# Patient Record
Sex: Female | Born: 1965 | Race: Black or African American | Hispanic: No | Smoking: Never smoker
Health system: Southern US, Community
[De-identification: ages and names within clinical notes are randomized; demographics above are authoritative.]

## PROBLEM LIST (undated history)

## (undated) DIAGNOSIS — M797 Fibromyalgia: Secondary | ICD-10-CM

## (undated) DIAGNOSIS — F32A Depression, unspecified: Secondary | ICD-10-CM

## (undated) DIAGNOSIS — G932 Benign intracranial hypertension: Secondary | ICD-10-CM

## (undated) DIAGNOSIS — G4700 Insomnia, unspecified: Secondary | ICD-10-CM

## (undated) DIAGNOSIS — F419 Anxiety disorder, unspecified: Secondary | ICD-10-CM

## (undated) DIAGNOSIS — G43909 Migraine, unspecified, not intractable, without status migrainosus: Secondary | ICD-10-CM

## (undated) HISTORY — PX: LEG SURGERY: SHX1003

## (undated) HISTORY — PX: SMALL INTESTINE SURGERY: SHX150

---

## 2020-09-10 ENCOUNTER — Other Ambulatory Visit: Payer: Self-pay

## 2020-09-10 ENCOUNTER — Ambulatory Visit
Admission: EM | Admit: 2020-09-10 | Discharge: 2020-09-10 | Disposition: A | Discharge status: Home / Self Care | Payer: Medicare HMO | Attending: Emergency Medicine | Admitting: Emergency Medicine

## 2020-09-10 DIAGNOSIS — U071 COVID-19: Secondary | ICD-10-CM | POA: Diagnosis not present

## 2020-09-10 DIAGNOSIS — M797 Fibromyalgia: Secondary | ICD-10-CM | POA: Diagnosis not present

## 2020-09-10 DIAGNOSIS — Z881 Allergy status to other antibiotic agents status: Secondary | ICD-10-CM | POA: Diagnosis not present

## 2020-09-10 DIAGNOSIS — G4700 Insomnia, unspecified: Secondary | ICD-10-CM | POA: Diagnosis not present

## 2020-09-10 DIAGNOSIS — F419 Anxiety disorder, unspecified: Secondary | ICD-10-CM | POA: Insufficient documentation

## 2020-09-10 DIAGNOSIS — Z888 Allergy status to other drugs, medicaments and biological substances status: Secondary | ICD-10-CM | POA: Insufficient documentation

## 2020-09-10 DIAGNOSIS — J069 Acute upper respiratory infection, unspecified: Secondary | ICD-10-CM

## 2020-09-10 DIAGNOSIS — Z79899 Other long term (current) drug therapy: Secondary | ICD-10-CM | POA: Diagnosis not present

## 2020-09-10 DIAGNOSIS — F329 Major depressive disorder, single episode, unspecified: Secondary | ICD-10-CM | POA: Diagnosis not present

## 2020-09-10 DIAGNOSIS — Z20822 Contact with and (suspected) exposure to covid-19: Secondary | ICD-10-CM | POA: Diagnosis present

## 2020-09-10 HISTORY — DX: Depression, unspecified: F32.A

## 2020-09-10 HISTORY — DX: Benign intracranial hypertension: G93.2

## 2020-09-10 HISTORY — DX: Insomnia, unspecified: G47.00

## 2020-09-10 HISTORY — DX: Fibromyalgia: M79.7

## 2020-09-10 HISTORY — DX: Anxiety disorder, unspecified: F41.9

## 2020-09-10 HISTORY — DX: Migraine, unspecified, not intractable, without status migrainosus: G43.909

## 2020-09-10 NOTE — Discharge Instructions (Addendum)
Continue to quarantine at home until you have not been febrile for 24 hours without medication or after 10 days if your symptoms are improving.   Use OTC Tylenol as needed for pain and fever. If your fever and pain do not respond to Tylenol you can try children's Ibuprofen liquid- take it with food and only as a last resort given your gastric bypass history.  Rest, push fluids, and return for worsening symptoms.

## 2020-09-10 NOTE — ED Triage Notes (Signed)
Patient in today w/ c/o cough, chills, fever, B/A, H/A, "scratchy" throat. Patient denies loss of taste/smell. Sx onset x 2 days. Patient states her daughter is COVID positive.   Patient is not vaccinated against COVID-19.

## 2020-09-10 NOTE — ED Provider Notes (Signed)
[facility]    CSN: [id number] Arrival date & time: 09/10/20  0803      History   Chief Complaint Chief Complaint  Patient presents with   Covid Exposure   Cough   Headache   Generalized Body Aches   Chills   Fever    HPI Jill Garcia is a 54 y.o. female.   54 yo female here for evaluation of HA, cough, scratchy throat, chills, sinus pressure and body aches x 3 days. Her daughter is COVID positive- diagnosed 10 days ago. Pt had a negative COVID test at that time. She is not vaccinated and has been using Turmeric, Emergen-C, APAP, Mucinex, and tea with minimal improvement at home. No SOB, N/V/D, or runny nose.      Past Medical History:  Diagnosis Date   Anxiety    Depression    Fibromyalgia    Insomnia    Migraines    Pseudotumor     There are no problems to display for this patient.   Past Surgical History:  Procedure Laterality Date   LEG SURGERY     SMALL INTESTINE SURGERY      OB History   No obstetric history on file.      Home Medications    Prior to Admission medications   Medication Sig Start Date End Date Taking? Authorizing Provider  AIMOVIG 140 MG/ML SOAJ SMARTSIG:140 Milligram(s) SUB-Q Once a Month 08/31/20  Yes [provider]  ALPRAZolam Prudy Feeler) 0.5 MG tablet Take by mouth.   Yes [provider]  chlorzoxazone (PARAFON) 500 MG tablet Take by mouth. 08/08/20  Yes [provider]  cyanocobalamin (,VITAMIN B-12,) 1000 MCG/ML injection Inject into the muscle.   Yes [provider]  gabapentin (NEURONTIN) 300 MG capsule Take 300 mg by mouth. 08/01/20  Yes [provider]  Ketorolac Tromethamine (SPRIX) 15.75 MG/SPRAY SOLN Use ONE psray IN each nostril EVERY 8 HOURS AS NEEDED for migraine for 5 days 02/10/20  Yes [provider]  nitroGLYCERIN (NITROSTAT) 0.4 MG SL tablet SMARTSIG:1 Tablet(s) Sublingual PRN 04/03/20  Yes [provider]  sertraline (ZOLOFT)  50 MG tablet Take 50 mg by mouth at bedtime. 06/17/20  Yes [provider]  tiZANidine (ZANAFLEX) 2 MG tablet Take 2 mg by mouth 3 (three) times daily as needed. 05/12/20  Yes [provider]  topiramate (TOPAMAX) 200 MG tablet Take 200 mg by mouth at bedtime. 08/02/20  Yes [provider]  traZODone (DESYREL) 50 MG tablet Take by mouth.   Yes [provider]    Family History No family history on file.  Social History Social History   Tobacco Use   Smoking status: Never Smoker   Smokeless tobacco: Never Used  Substance Use Topics   Alcohol use: Never   Drug use: Not on file     Allergies   Tape and Clindamycin   Review of Systems Review of Systems  Constitutional: Positive for chills, fatigue and fever. Negative for activity change and appetite change.  HENT: Positive for congestion, postnasal drip, sinus pressure and sore throat. Negative for ear discharge, ear pain and sinus pain.   Respiratory: Positive for cough. Negative for chest tightness and shortness of breath.   Cardiovascular: Negative for chest pain.  Gastrointestinal: Negative for diarrhea, nausea and vomiting.  Musculoskeletal: Positive for arthralgias and myalgias.  Skin: Negative for color change and rash.  Neurological: Positive for headaches. Negative for tremors.  Psychiatric/Behavioral: Negative.      Physical  Exam Triage Vital Signs ED Triage Vitals  Enc Vitals Group     BP 09/10/20 0825 128/84     Pulse Rate 09/10/20 0825 78     Resp 09/10/20 0825 18     Temp 09/10/20 0825 (!) 102.5 F (39.2 C)     Temp Source 09/10/20 0825 Oral     SpO2 09/10/20 0825 98 %     Weight 09/10/20 0827 284 lb (128.8 kg)     Height 09/10/20 0827 5\' 6"  (1.676 m)     Head Circumference --      Peak Flow --      Pain Score 09/10/20 0826 9     Pain Loc --      Pain Edu? --      Excl. in GC? --    No data found.  Updated Vital Signs BP 128/84 (BP Location: Left Arm)     Pulse 78    Temp (!) 102.5 F (39.2 C) (Oral)    Resp 18    Ht 5\' 6"  (1.676 m)    Wt 284 lb (128.8 kg)    SpO2 98%    BMI 45.84 kg/m   Visual Acuity Right Eye Distance:   Left Eye Distance:   Bilateral Distance:    Right Eye Near:   Left Eye Near:    Bilateral Near:     Physical Exam Vitals and nursing note reviewed.  Constitutional:      General: She is not in acute distress.    Appearance: She is well-developed. She is obese. She is ill-appearing.  HENT:     Head: Normocephalic and atraumatic.     Jaw: There is normal jaw occlusion.     Right Ear: Hearing, tympanic membrane, ear canal and external ear normal.     Left Ear: Hearing, tympanic membrane, ear canal and external ear normal.     Nose: Nose normal. No mucosal edema, congestion or rhinorrhea.     Right Nostril: No occlusion.     Left Nostril: No occlusion.     Right Turbinates: Not enlarged, swollen or pale.     Left Turbinates: Not enlarged, swollen or pale.     Right Sinus: No maxillary sinus tenderness or frontal sinus tenderness.     Left Sinus: No maxillary sinus tenderness or frontal sinus tenderness.     Mouth/Throat:     Lips: Pink.     Mouth: Mucous membranes are moist.     Tongue: No lesions. Tongue does not deviate from midline.     Pharynx: Oropharynx is clear.  Eyes:     Extraocular Movements: Extraocular movements intact.     Pupils: Pupils are equal, round, and reactive to light.  Cardiovascular:     Rate and Rhythm: Normal rate and regular rhythm.     Heart sounds: Normal heart sounds.  Pulmonary:     Effort: Pulmonary effort is normal.     Breath sounds: Normal breath sounds.  Musculoskeletal:        General: Normal range of motion.     Cervical back: Normal range of motion and neck supple.  Skin:    General: Skin is warm and dry.     Capillary Refill: Capillary refill takes less than 2 seconds.     Findings: No rash.  Neurological:     Mental Status: She is alert.      UC Treatments  / Results  Labs (all labs ordered are listed, but only abnormal results are displayed)  Labs Reviewed  SARS CORONAVIRUS 2 (TAT 6-24 HRS)    EKG   Radiology No results found.  Procedures Procedures (including critical care time)  Medications Ordered in UC Medications - No data to display  Initial Impression / Assessment and Plan / UC Course  I have reviewed the triage vital signs and the nursing notes.  Pertinent labs & imaging results that were available during my care of the patient were reviewed by me and considered in my medical decision making (see chart for details).   Patient presents with COVID symptoms. Her daughter is positive- diagnosed 10 days ago and has been in quarantine. The patient is not vaccinated against COVID and she developed symptoms 3 days ago.  PMH significant for gastric bypass, fibromyalgia, chronic pain, and lumbar radiculopathy. She is followed by [facility] and recentoly had a nerve block injection in L4-5 and sacral sac on 08/24/20.  She is febrile at 102 but no hypoxia or SOB.  Will test for COVID and D/C home with supportive care and quarantine.  Final Clinical Impressions(s) / UC Diagnoses   Final diagnoses:  Viral URI with cough     Discharge Instructions     Continue to quarantine at home until you have not been febrile for 24 hours without medication or after 10 days if your symptoms are improving.   Use OTC Tylenol as needed for pain and fever. If your fever and pain do not respond to Tylenol you can try children's Ibuprofen liquid- take it with food and only as a last resort given your gastric bypass history.  Rest, push fluids, and return for worsening symptoms.     ED Prescriptions    None     PDMP not reviewed this encounter.   Becky Augusta, NP 09/10/20 [phone]

## 2020-09-11 LAB — SARS CORONAVIRUS 2 (TAT 6-24 HRS): SARS Coronavirus 2: POSITIVE — AB

## 2020-09-15 ENCOUNTER — Encounter: Payer: Self-pay | Admitting: Emergency Medicine

## 2020-09-15 ENCOUNTER — Other Ambulatory Visit: Payer: Self-pay

## 2020-09-15 DIAGNOSIS — F419 Anxiety disorder, unspecified: Secondary | ICD-10-CM | POA: Diagnosis present

## 2020-09-15 DIAGNOSIS — Z888 Allergy status to other drugs, medicaments and biological substances status: Secondary | ICD-10-CM

## 2020-09-15 DIAGNOSIS — M797 Fibromyalgia: Secondary | ICD-10-CM | POA: Diagnosis present

## 2020-09-15 DIAGNOSIS — F329 Major depressive disorder, single episode, unspecified: Secondary | ICD-10-CM | POA: Diagnosis present

## 2020-09-15 DIAGNOSIS — J159 Unspecified bacterial pneumonia: Secondary | ICD-10-CM | POA: Diagnosis present

## 2020-09-15 DIAGNOSIS — G43909 Migraine, unspecified, not intractable, without status migrainosus: Secondary | ICD-10-CM | POA: Diagnosis present

## 2020-09-15 DIAGNOSIS — U071 COVID-19: Principal | ICD-10-CM | POA: Diagnosis present

## 2020-09-15 DIAGNOSIS — J1282 Pneumonia due to coronavirus disease 2019: Secondary | ICD-10-CM | POA: Diagnosis present

## 2020-09-15 DIAGNOSIS — Z79899 Other long term (current) drug therapy: Secondary | ICD-10-CM

## 2020-09-15 DIAGNOSIS — J9601 Acute respiratory failure with hypoxia: Secondary | ICD-10-CM | POA: Diagnosis present

## 2020-09-15 DIAGNOSIS — Z881 Allergy status to other antibiotic agents status: Secondary | ICD-10-CM

## 2020-09-15 DIAGNOSIS — A0839 Other viral enteritis: Secondary | ICD-10-CM | POA: Diagnosis present

## 2020-09-15 DIAGNOSIS — G932 Benign intracranial hypertension: Secondary | ICD-10-CM | POA: Diagnosis present

## 2020-09-15 LAB — CBC WITH DIFFERENTIAL/PLATELET
Abs Immature Granulocytes: 0.01 10*3/uL (ref 0.00–0.07)
Basophils Absolute: 0 10*3/uL (ref 0.0–0.1)
Basophils Relative: 0 %
Eosinophils Absolute: 0 10*3/uL (ref 0.0–0.5)
Eosinophils Relative: 0 %
HCT: 39.4 % (ref 36.0–46.0)
Hemoglobin: 12.6 g/dL (ref 12.0–15.0)
Immature Granulocytes: 0 %
Lymphocytes Relative: 11 %
Lymphs Abs: 0.4 10*3/uL — ABNORMAL LOW (ref 0.7–4.0)
MCH: 28.8 pg (ref 26.0–34.0)
MCHC: 32 g/dL (ref 30.0–36.0)
MCV: 90.2 fL (ref 80.0–100.0)
Monocytes Absolute: 0.1 10*3/uL (ref 0.1–1.0)
Monocytes Relative: 4 %
Neutro Abs: 2.9 10*3/uL (ref 1.7–7.7)
Neutrophils Relative %: 85 %
Platelets: 132 10*3/uL — ABNORMAL LOW (ref 150–400)
RBC: 4.37 MIL/uL (ref 3.87–5.11)
RDW: 12.6 % (ref 11.5–15.5)
WBC: 3.3 10*3/uL — ABNORMAL LOW (ref 4.0–10.5)
nRBC: 0 % (ref 0.0–0.2)

## 2020-09-15 LAB — BASIC METABOLIC PANEL
Anion gap: 5 (ref 5–15)
BUN: 12 mg/dL (ref 6–20)
CO2: 26 mmol/L (ref 22–32)
Calcium: 8.3 mg/dL — ABNORMAL LOW (ref 8.9–10.3)
Chloride: 106 mmol/L (ref 98–111)
Creatinine, Ser: 0.67 mg/dL (ref 0.44–1.00)
GFR calc Af Amer: >60 mL/min (ref >60)
GFR calc non Af Amer: >60 mL/min (ref >60)
Glucose, Bld: 146 mg/dL — ABNORMAL HIGH (ref 70–99)
Potassium: 3.8 mmol/L (ref 3.5–5.1)
Sodium: 137 mmol/L (ref 135–145)

## 2020-09-15 LAB — URINALYSIS, COMPLETE (UACMP) WITH MICROSCOPIC
Bilirubin Urine: NEGATIVE
Glucose, UA: NEGATIVE mg/dL
Hgb urine dipstick: NEGATIVE
Ketones, ur: NEGATIVE mg/dL
Leukocytes,Ua: NEGATIVE
Nitrite: NEGATIVE
Protein, ur: 100 mg/dL — AB
Specific Gravity, Urine: 1.033 — ABNORMAL HIGH (ref 1.005–1.030)
pH: 5 (ref 5.0–8.0)

## 2020-09-15 NOTE — ED Triage Notes (Signed)
Pt is Covid positive and arrives to the ED today due to being "winded and shaky" after her Covid antibody infusion yesterday. Pt has bilateral clear lung sounds and is in NAD.

## 2020-09-16 ENCOUNTER — Emergency Department: Payer: Medicare HMO

## 2020-09-16 ENCOUNTER — Encounter: Payer: Self-pay | Admitting: Family Medicine

## 2020-09-16 ENCOUNTER — Inpatient Hospital Stay
Admission: EM | Admit: 2020-09-16 | Discharge: 2020-09-20 | DRG: 177 | Disposition: A | Discharge status: Home Health | Payer: Medicare HMO | Attending: Internal Medicine | Admitting: Internal Medicine

## 2020-09-16 ENCOUNTER — Inpatient Hospital Stay: Payer: Medicare HMO

## 2020-09-16 DIAGNOSIS — Z888 Allergy status to other drugs, medicaments and biological substances status: Secondary | ICD-10-CM | POA: Diagnosis not present

## 2020-09-16 DIAGNOSIS — J1282 Pneumonia due to coronavirus disease 2019: Secondary | ICD-10-CM

## 2020-09-16 DIAGNOSIS — F419 Anxiety disorder, unspecified: Secondary | ICD-10-CM

## 2020-09-16 DIAGNOSIS — M797 Fibromyalgia: Secondary | ICD-10-CM

## 2020-09-16 DIAGNOSIS — J9601 Acute respiratory failure with hypoxia: Secondary | ICD-10-CM | POA: Diagnosis present

## 2020-09-16 DIAGNOSIS — Z79899 Other long term (current) drug therapy: Secondary | ICD-10-CM | POA: Diagnosis not present

## 2020-09-16 DIAGNOSIS — A0839 Other viral enteritis: Secondary | ICD-10-CM | POA: Diagnosis present

## 2020-09-16 DIAGNOSIS — G932 Benign intracranial hypertension: Secondary | ICD-10-CM

## 2020-09-16 DIAGNOSIS — G43909 Migraine, unspecified, not intractable, without status migrainosus: Secondary | ICD-10-CM | POA: Diagnosis present

## 2020-09-16 DIAGNOSIS — U071 COVID-19: Principal | ICD-10-CM | POA: Diagnosis present

## 2020-09-16 DIAGNOSIS — J159 Unspecified bacterial pneumonia: Secondary | ICD-10-CM | POA: Diagnosis present

## 2020-09-16 DIAGNOSIS — Z881 Allergy status to other antibiotic agents status: Secondary | ICD-10-CM | POA: Diagnosis not present

## 2020-09-16 DIAGNOSIS — F329 Major depressive disorder, single episode, unspecified: Secondary | ICD-10-CM | POA: Diagnosis present

## 2020-09-16 DIAGNOSIS — R0602 Shortness of breath: Secondary | ICD-10-CM | POA: Diagnosis present

## 2020-09-16 LAB — FIBRIN DERIVATIVES D-DIMER (ARMC ONLY)
Fibrin derivatives D-dimer (ARMC): 1105.27 ng/mL (FEU) — ABNORMAL HIGH (ref 0.00–499.00)
Fibrin derivatives D-dimer (ARMC): 994.84 ng/mL (FEU) — ABNORMAL HIGH (ref 0.00–499.00)

## 2020-09-16 LAB — HEMOGLOBIN A1C
Hgb A1c MFr Bld: 6 % — ABNORMAL HIGH (ref 4.8–5.6)
Mean Plasma Glucose: 125.5 mg/dL

## 2020-09-16 LAB — TROPONIN I (HIGH SENSITIVITY): Troponin I (High Sensitivity): 5 ng/L (ref <18)

## 2020-09-16 LAB — HIV ANTIBODY (ROUTINE TESTING W REFLEX): HIV Screen 4th Generation wRfx: NONREACTIVE

## 2020-09-16 MED ORDER — SODIUM CHLORIDE 0.9 % IV SOLN: INTRAVENOUS | Status: DC

## 2020-09-16 MED ORDER — ZINC SULFATE 220 (50 ZN) MG PO CAPS
220.0000 mg | ORAL_CAPSULE | Freq: Every day | ORAL | Status: DC
  Administered 2020-09-16 – 2020-09-20 (×5): 220 mg via ORAL
  Filled 2020-09-16 (×5): qty 1

## 2020-09-16 MED ORDER — ASCORBIC ACID 500 MG PO TABS
500.0000 mg | ORAL_TABLET | Freq: Every day | ORAL | Status: DC
  Administered 2020-09-16 – 2020-09-20 (×5): 500 mg via ORAL
  Filled 2020-09-16 (×5): qty 1

## 2020-09-16 MED ORDER — GUAIFENESIN ER 600 MG PO TB12
600.0000 mg | ORAL_TABLET | Freq: Two times a day (BID) | ORAL | Status: DC
  Administered 2020-09-16 – 2020-09-20 (×9): 600 mg via ORAL
  Filled 2020-09-16 (×9): qty 1

## 2020-09-16 MED ORDER — GABAPENTIN 300 MG PO CAPS
600.0000 mg | ORAL_CAPSULE | Freq: Every day | ORAL | Status: DC
  Filled 2020-09-16: qty 2

## 2020-09-16 MED ORDER — IOHEXOL 350 MG/ML SOLN
100.0000 mL | Freq: Once | INTRAVENOUS | Status: AC | PRN
  Administered 2020-09-16: 100 mL via INTRAVENOUS

## 2020-09-16 MED ORDER — PREDNISONE 20 MG PO TABS
50.0000 mg | ORAL_TABLET | Freq: Every day | ORAL | Status: DC
  Administered 2020-09-19 – 2020-09-20 (×2): 50 mg via ORAL
  Filled 2020-09-16 (×2): qty 1

## 2020-09-16 MED ORDER — LACTATED RINGERS IV BOLUS
1000.0000 mL | Freq: Once | INTRAVENOUS | Status: AC
  Administered 2020-09-16: 1000 mL via INTRAVENOUS

## 2020-09-16 MED ORDER — ACETAMINOPHEN 500 MG PO TABS
1000.0000 mg | ORAL_TABLET | Freq: Once | ORAL | Status: AC
  Administered 2020-09-16: 1000 mg via ORAL
  Filled 2020-09-16: qty 2

## 2020-09-16 MED ORDER — VITAMIN B-12 1000 MCG PO TABS
1000.0000 ug | ORAL_TABLET | Freq: Every day | ORAL | Status: DC
  Administered 2020-09-16 – 2020-09-20 (×5): 1000 ug via ORAL
  Filled 2020-09-16 (×5): qty 1

## 2020-09-16 MED ORDER — ASPIRIN EC 81 MG PO TBEC
81.0000 mg | DELAYED_RELEASE_TABLET | Freq: Every day | ORAL | Status: DC
  Administered 2020-09-17 – 2020-09-18 (×2): 81 mg via ORAL
  Filled 2020-09-16 (×5): qty 1

## 2020-09-16 MED ORDER — ONDANSETRON HCL 4 MG/2ML IJ SOLN: 4.0000 mg | Freq: Four times a day (QID) | INTRAMUSCULAR | Status: DC | PRN

## 2020-09-16 MED ORDER — VITAMIN D 25 MCG (1000 UNIT) PO TABS
1000.0000 [IU] | ORAL_TABLET | Freq: Every day | ORAL | Status: DC
  Administered 2020-09-16 – 2020-09-20 (×5): 1000 [IU] via ORAL
  Filled 2020-09-16 (×5): qty 1

## 2020-09-16 MED ORDER — TRAZODONE HCL 50 MG PO TABS
50.0000 mg | ORAL_TABLET | Freq: Every day | ORAL | Status: DC
  Administered 2020-09-17: 22:00:00 50 mg via ORAL
  Filled 2020-09-16 (×3): qty 1

## 2020-09-16 MED ORDER — ONDANSETRON HCL 4 MG PO TABS: 4.0000 mg | ORAL_TABLET | Freq: Four times a day (QID) | ORAL | Status: DC | PRN

## 2020-09-16 MED ORDER — FAMOTIDINE 20 MG PO TABS
20.0000 mg | ORAL_TABLET | Freq: Two times a day (BID) | ORAL | Status: DC
  Administered 2020-09-16 – 2020-09-20 (×9): 20 mg via ORAL
  Filled 2020-09-16 (×9): qty 1

## 2020-09-16 MED ORDER — SERTRALINE HCL 50 MG PO TABS
50.0000 mg | ORAL_TABLET | Freq: Every day | ORAL | Status: DC
  Administered 2020-09-16 – 2020-09-19 (×4): 50 mg via ORAL
  Filled 2020-09-16 (×4): qty 1

## 2020-09-16 MED ORDER — DULOXETINE HCL 60 MG PO CPEP
90.0000 mg | ORAL_CAPSULE | Freq: Every day | ORAL | Status: DC
  Filled 2020-09-16 (×5): qty 1

## 2020-09-16 MED ORDER — SODIUM CHLORIDE 0.9 % IV SOLN
100.0000 mg | Freq: Every day | INTRAVENOUS | Status: AC
  Administered 2020-09-17 – 2020-09-20 (×4): 100 mg via INTRAVENOUS
  Filled 2020-09-16 (×4): qty 20

## 2020-09-16 MED ORDER — NITROGLYCERIN 0.4 MG SL SUBL: 0.4000 mg | SUBLINGUAL_TABLET | SUBLINGUAL | Status: DC | PRN

## 2020-09-16 MED ORDER — ENOXAPARIN SODIUM 80 MG/0.8ML ~~LOC~~ SOLN
65.0000 mg | SUBCUTANEOUS | Status: DC
  Administered 2020-09-16 – 2020-09-20 (×5): 65 mg via SUBCUTANEOUS
  Filled 2020-09-16 (×5): qty 0.8

## 2020-09-16 MED ORDER — ALPRAZOLAM 0.5 MG PO TABS
0.5000 mg | ORAL_TABLET | Freq: Every day | ORAL | Status: DC
  Administered 2020-09-16 – 2020-09-19 (×3): 0.5 mg via ORAL
  Filled 2020-09-16 (×4): qty 1

## 2020-09-16 MED ORDER — MAGNESIUM HYDROXIDE 400 MG/5ML PO SUSP
30.0000 mL | Freq: Every day | ORAL | Status: DC | PRN
  Filled 2020-09-16: qty 30

## 2020-09-16 MED ORDER — TOPIRAMATE 100 MG PO TABS
200.0000 mg | ORAL_TABLET | Freq: Every day | ORAL | Status: DC
  Administered 2020-09-16 – 2020-09-19 (×4): 200 mg via ORAL
  Filled 2020-09-16 (×3): qty 2
  Filled 2020-09-16: qty 8
  Filled 2020-09-16 (×2): qty 2

## 2020-09-16 MED ORDER — METHYLPREDNISOLONE SODIUM SUCC 125 MG IJ SOLR
125.0000 mg | Freq: Once | INTRAMUSCULAR | Status: AC
  Administered 2020-09-16: 125 mg via INTRAVENOUS
  Filled 2020-09-16: qty 2

## 2020-09-16 MED ORDER — BARICITINIB 1 MG PO TABS
4.0000 mg | ORAL_TABLET | Freq: Every day | ORAL | Status: DC
  Administered 2020-09-16 – 2020-09-20 (×5): 4 mg via ORAL
  Filled 2020-09-16 (×4): qty 2
  Filled 2020-09-16: qty 4

## 2020-09-16 MED ORDER — HYDROCOD POLST-CPM POLST ER 10-8 MG/5ML PO SUER: 5.0000 mL | Freq: Two times a day (BID) | ORAL | Status: DC | PRN

## 2020-09-16 MED ORDER — GUAIFENESIN-DM 100-10 MG/5ML PO SYRP: 10.0000 mL | ORAL_SOLUTION | ORAL | Status: DC | PRN

## 2020-09-16 MED ORDER — SODIUM CHLORIDE 0.9 % IV SOLN
200.0000 mg | Freq: Once | INTRAVENOUS | Status: AC
  Administered 2020-09-16: 200 mg via INTRAVENOUS
  Filled 2020-09-16: qty 200

## 2020-09-16 MED ORDER — GABAPENTIN 300 MG PO CAPS
300.0000 mg | ORAL_CAPSULE | Freq: Two times a day (BID) | ORAL | Status: DC
  Administered 2020-09-16 – 2020-09-20 (×8): 300 mg via ORAL
  Filled 2020-09-16 (×8): qty 1

## 2020-09-16 MED ORDER — SODIUM CHLORIDE 0.9 % IV SOLN
1.0000 mg/kg | Freq: Two times a day (BID) | INTRAVENOUS | Status: AC
  Administered 2020-09-16 – 2020-09-19 (×6): 130 mg via INTRAVENOUS
  Filled 2020-09-16 (×7): qty 1.04

## 2020-09-16 MED ORDER — TRAZODONE HCL 50 MG PO TABS
25.0000 mg | ORAL_TABLET | Freq: Every evening | ORAL | Status: DC | PRN
  Filled 2020-09-16: qty 1

## 2020-09-16 MED ORDER — CHLORZOXAZONE 500 MG PO TABS
500.0000 mg | ORAL_TABLET | Freq: Four times a day (QID) | ORAL | Status: DC | PRN
  Administered 2020-09-16: 500 mg via ORAL
  Filled 2020-09-16 (×2): qty 1

## 2020-09-16 MED ORDER — TIZANIDINE HCL 2 MG PO TABS
2.0000 mg | ORAL_TABLET | Freq: Three times a day (TID) | ORAL | Status: DC | PRN
  Filled 2020-09-16: qty 1

## 2020-09-16 MED ORDER — ACETAMINOPHEN 325 MG PO TABS
650.0000 mg | ORAL_TABLET | Freq: Four times a day (QID) | ORAL | Status: DC | PRN
  Administered 2020-09-16: 11:00:00 650 mg via ORAL
  Filled 2020-09-16: qty 2

## 2020-09-16 NOTE — ED Notes (Addendum)
Pt out of room, states "I need stuff" pt informed this RN is her nurse and will be in shortly but must place protective airway gear in place to see pt. Pt refuses to get back in room. Pt states 'i've been in here and i'm cold and I don't have a nurse button". Pt provided with three warm blankets and again reiterated to pt will be in as soon as possible to see. Pt in no respiratory distress, resps unlabored and even.

## 2020-09-16 NOTE — H&P (Signed)
[place]   PATIENT NAME: Jill Garcia    MR#:  [id number]  DATE OF BIRTH:  15-Jul-1966  DATE OF ADMISSION:  09/16/2020  PRIMARY CARE PHYSICIAN: Roanna Raider, PA   REQUESTING/REFERRING PHYSICIAN: Nita Sickle, MD CHIEF COMPLAINT:   Chief Complaint  Patient presents with  . Medication Reaction    HISTORY OF PRESENT ILLNESS:  Jill Garcia  is a 54 y.o. African-American female with a known history of anxiety/depression, fibromyalgia and migraine, as well as COVID-19 that was diagnosed 6 days ago.  Her symptoms started on Saturday with generalized body aches as well as fever and chills, dry cough with rhinorrhea and nasal and sinus congestion.  She has been having worsening dyspnea as well as fatigue.  She admitted to occasional sore throat with cough.  She was seen yesterday at [facility] urgent care and received monoclonal antibody infusion.  She has been more short of breath since yesterday.  She admitted to diarrhea without nausea or vomiting.  No chest pain or palpitations.  No leg pain or edema.  No dysuria, oliguria or hematuria or flank pain.  She has not been vaccinated against COVID-19.  Upon presentation to the emergency room, respiratory rate was 26 with otherwise normal vital signs.  She later desatted to 89% on room air with minimal ambulation.  She was then placed on 2 L O2 by nasal cannula.  Labs revealed unremarkable CMP and CBC showed mild leukopenia with relative lymphopenia and thrombocytopenia.  Urinalysis showed 6-10 WBCs with rare bacteria and negative nitrite and leukocytes.  Portable chest x-ray showed bilateral patchy perihilar and basal infiltrates consistent with multifocal Covid pneumonia. EKG showed normal sinus rhythm with rate of 64.  Her positive COVID-19 test was on 9/20.  The patient was given IV lactated Ringer bolus, IV Solu-Medrol and remdesivir.  She will be admitted to a medical monitored bed for further evaluation and  management. PAST MEDICAL HISTORY:   Past Medical History:  Diagnosis Date  . Anxiety   . Depression   . Fibromyalgia   . Insomnia   . Migraines   . 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 LEG SURGERY    . SMALL INTESTINE SURGERY    Nerve blocks for back pain  SOCIAL HISTORY:   Social History   Tobacco Use  . Smoking status: Never Smoker  . Smokeless tobacco: Never Used  Substance Use Topics  . Alcohol use: Never    FAMILY HISTORY:  No family history on file.  She denies any familial diseases.  DRUG ALLERGIES:   Allergies  Allergen Reactions  . Tape Hives  . Clindamycin Other (See Comments)    Pt does not remember, possible itching Pt does not remember, possible itching     REVIEW OF SYSTEMS:   ROS As per history of present illness. All pertinent systems were reviewed above. Constitutional, HEENT, cardiovascular, respiratory, GI, GU, musculoskeletal, neuro, psychiatric, endocrine, integumentary and hematologic systems were reviewed and are otherwise negative/unremarkable except for positive findings mentioned above in the HPI.   MEDICATIONS AT HOME:   Prior to Admission medications   Medication Sig Start Date End Date Taking? Authorizing Provider  AIMOVIG 140 MG/ML SOAJ SMARTSIG:140 Milligram(s) SUB-Q Once a Month 08/31/20  Yes [provider]  ALPRAZolam (XANAX) 0.5 MG tablet Take 0.5 mg by mouth at bedtime.    Yes [provider]  chlorzoxazone (PARAFON) 500 MG tablet Take 500 mg by mouth every 6 (six) hours as needed for muscle spasms.  08/08/20  Yes [provider]  cyanocobalamin 1000 MCG tablet Take 1,000 mcg by mouth daily.   Yes [provider]  doxycycline (VIBRAMYCIN) 100 MG capsule Take 100 mg by mouth 2 (two) times daily. 09/14/20  Yes [provider]  DULoxetine (CYMBALTA) 60 MG capsule Take 90 mg  by mouth daily.   Yes [provider]  gabapentin (NEURONTIN) 300 MG capsule Take 300 mg by mouth as directed. Take 300 mg by mouth as directed Take in the evening as directed (Day 1: 300mg . Day 2: 600mg . Day 3-6: 900mg . Day 7-10: 1200mg . Day 11-14: 1500mg . Day 15+: 1800mg ) 08/01/20  Yes [provider]  sertraline (ZOLOFT) 50 MG tablet Take 50 mg by mouth at bedtime. 06/17/20  Yes [provider]  topiramate (TOPAMAX) 200 MG tablet Take 200 mg by mouth at bedtime. 08/02/20  Yes [provider]  traZODone (DESYREL) 50 MG tablet Take 50 mg by mouth at bedtime.    Yes [provider]  Ketorolac Tromethamine (SPRIX) 15.75 MG/SPRAY SOLN Use ONE psray IN each nostril EVERY 8 HOURS AS NEEDED for migraine for 5 days 02/10/20   [provider]  nitroGLYCERIN (NITROSTAT) 0.4 MG SL tablet SMARTSIG:1 Tablet(s) Sublingual PRN 04/03/20   [provider]  tiZANidine (ZANAFLEX) 2 MG tablet Take 2 mg by mouth 3 (three) times daily as needed. 05/12/20   [provider]      VITAL SIGNS:  Blood pressure 113/72, pulse 62, temperature 98.3 F (36.8 C), temperature source Oral, resp. rate (!) 26, height 5\' 6"  (1.676 m), weight 128.8 kg, SpO2 100 %.  PHYSICAL EXAMINATION:  Physical Exam  GENERAL:  54 y.o.-year-old African-American female patient lying in the bed with mild respiratory distress with conversational dyspnea. EYES: Pupils equal, round, reactive to light and accommodation. No scleral icterus. Extraocular muscles intact.  HEENT: Head atraumatic, normocephalic. Oropharynx and nasopharynx clear.  NECK:  Supple, no jugular venous distention. No thyroid enlargement, no tenderness.  LUNGS: Diminished bibasal breath sounds with bibasal and midlung zone crackles.06/19/20  CARDIOVASCULAR: Regular rate and rhythm, S1, S2 normal. No murmurs, rubs, or gallops.  ABDOMEN: Soft, nondistended, nontender. Bowel sounds present. No organomegaly or mass.   EXTREMITIES: No pedal edema, cyanosis, or clubbing.  NEUROLOGIC: Cranial nerves II through XII are intact. Muscle strength 5/5 in all extremities. Sensation intact. Gait not checked.  PSYCHIATRIC: The patient is alert and oriented x 3.  Normal affect and good eye contact. SKIN: No obvious rash, lesion, or ulcer.   LABORATORY PANEL:   CBC Recent Labs  Lab 09/15/20 1937  WBC 3.3*  HGB 12.6  HCT 39.4  PLT 132*   ------------------------------------------------------------------------------------------------------------------  Chemistries  Recent Labs  Lab 09/15/20 1937  NA 137  K 3.8  CL 106  CO2 26  GLUCOSE 146*  BUN 12  CREATININE 0.67  CALCIUM 8.3*   ------------------------------------------------------------------------------------------------------------------  Cardiac Enzymes No results for input(s): TROPONINI in the last 168 hours. ------------------------------------------------------------------------------------------------------------------  RADIOLOGY:  DG Chest Portable 1 View  Result Date: 09/16/2020 CLINICAL DATA:  Shortness of breath and COVID positive. EXAM: PORTABLE CHEST 1 VIEW COMPARISON:  12/29/2019 FINDINGS: Shallow inspiration. Cardiac enlargement. Patchy perihilar and basilar infiltrates consistent with multifocal COVID pneumonia. No pleural effusions. No pneumothorax. Mediastinal contours appear intact. IMPRESSION: Patchy perihilar and basilar infiltrates consistent with multifocal COVID pneumonia. Electronically Signed   By:  Andria Meuse M.D.   On: 09/16/2020 02:15      IMPRESSION AND PLAN:   1.  Acute hypoxemic respiratory failure secondary to COVID-19. -The patient will be admitted to a medically monitored isolation bed. -O2 protocol will be followed to keep O2 saturation above 93.   2.  Multifocal pneumonia secondary to COVID-19. -The patient will be admitted to an isolation monitored bed with droplet and contact precautions. -Given  multifocal pneumonia we will empirically place the patient on IV Rocephin and Zithromax for possible bacterial superinfection only with elevated Procalcitonin. -The patient will be placed on scheduled Mucinex and as needed Tussionex. -We will avoid nebulization as much as we can, give bronchodilator MDI if needed, and with deterioration of oxygenation try to avoid BiPAP/CPAP if possible.    -Will obtain sputum Gram stain culture and sensitivity and follow blood cultures. -O2 protocol will be followed. -We will follow CRP, ferritin, LDH and D-dimer. -Will follow manual differential for ANC/ALC ratio as well as follow troponin I and daily CBC with manual differential and CMP. - Will place the patient on IV Remdesivir and IV steroid therapy with IV Solu-Medrol with elevated inflammatory markers. -The patient will be placed on vitamin D3, vitamin C, zinc sulfate, p.o. Pepcid and aspirin. -I discussed Baricitinib and the patient agreed to proceed with it.  3.  Anxiety and depression. -We will continue Xanax and Cymbalta.  4.  History of pseudotumor cerebri. -We will continue p.o. Diamox.  5.  Fibromyalgia. -We will continue Parafon.  6.  DVT prophylaxis. -Subcutaneous Lovenox.    All the records are reviewed and case discussed with ED provider. The plan of care was discussed in details with the patient (and family). I answered all questions. The patient agreed to proceed with the above mentioned plan. Further management will depend upon hospital course.   CODE STATUS: Full code  Status is: Inpatient  Remains inpatient appropriate because:Ongoing diagnostic testing needed not appropriate for outpatient work up, Unsafe d/c plan, IV treatments appropriate due to intensity of illness or inability to take PO and Inpatient level of care appropriate due to severity of illness   Dispo: The patient is from: Home              Anticipated d/c is to: Home              Anticipated d/c date is: >  3 days              Patient currently is not medically stable to d/c.      TOTAL TIME TAKING CARE OF THIS PATIENT: 55 minutes.    Hannah Beat M.D on 09/16/2020 at 2:50 AM  [facility]   From 7 PM-7 AM, contact night-coverage [email or web address]  CC: Primary care physician; Roanna Raider, [place]

## 2020-09-16 NOTE — Progress Notes (Signed)
PROGRESS NOTE    Jill Garcia  NWG:[id number] DOB: 1966-12-09 DOA: 09/16/2020 PCP: Roanna Raider, PA   Brief Narrative:  Jill Garcia  is a 54 y.o. African-American female with a known history of anxiety/depression, fibromyalgia and migraine, as well as COVID-19 that was diagnosed 6 days ago.  Her symptoms started on Saturday with generalized body aches as well as fever and chills, dry cough with rhinorrhea and nasal and sinus congestion.  She has been having worsening dyspnea as well as fatigue.  She admitted to occasional sore throat with cough.  She was seen yesterday at [facility] urgent care and received monoclonal antibody infusion.  She has been more short of breath since yesterday.  She admitted to diarrhea without nausea or vomiting.  No chest pain or palpitations.  No leg pain or edema.  No dysuria, oliguria or hematuria or flank pain.  She has not been vaccinated against COVID-19.  Upon presentation to the emergency room, respiratory rate was 26 with otherwise normal vital signs.  She later desatted to 89% on room air with minimal ambulation.  She was then placed on 2 L O2 by nasal cannula.  Labs revealed unremarkable CMP and CBC showed mild leukopenia with relative lymphopenia and thrombocytopenia.  Urinalysis showed 6-10 WBCs with rare bacteria and negative nitrite and leukocytes.  Portable chest x-ray showed bilateral patchy perihilar and basal infiltrates consistent with multifocal Covid pneumonia. EKG showed normal sinus rhythm with rate of 64.  Her positive COVID-19 test was on 9/20.  The patient was given IV lactated Ringer bolus, IV Solu-Medrol and remdesivir.  She will be admitted to a medical monitored bed for further evaluation and management.   Assessment & Plan:   Active Problems:   Acute hypoxemic respiratory failure due to COVID-19 (HCC) 1. Acute hypoxemic respiratory failure secondary to COVID-19 /Multifocal pneumonia secondary to COVID-19. -The patient  will be admitted to a medically monitored isolation bed. -O2 protocol will be followed to keep O2 saturation above 93. -The patient will be admitted to an isolation monitored bed with droplet and contact precautions. -Given multifocal pneumonia we will empirically place the patient on IV Rocephin and Zithromax for possible bacterial superinfection only with elevated Procalcitonin. -The patient will be placed on scheduled Mucinex and as needed Tussionex. -We will avoid nebulization as much as we can, give bronchodilator MDI if needed, and with deterioration of oxygenation try to avoid BiPAP/CPAP if possible.  -Will obtain sputum Gram stain culture and sensitivity and follow blood cultures. -O2 protocol will be followed. -We will follow CRP, ferritin, LDH and D-dimer. -Will follow manual differential for ANC/ALC ratio as well as follow troponin I and daily CBC with manual differential and CMP. - Will place the patient on IV Remdesivir and IV steroid therapy with IV Solu-Medrol with elevated inflammatory markers. -The patient will be placed on vitamin D3, vitamin C, zinc sulfate, p.o. Pepcid and aspirin. -I discussed Baricitinib and the patient agreed to proceed with it.  Anxiety and depression. -We will continue Xanax and Cymbalta.  History of pseudotumor cerebri. -We will continue p.o. Diamox.  Fibromyalgia. -We will continue Parafon.   DVT prophylaxis: Lovenox. Code Status: Full Family Communication: None at bedside. Disposition Plan: Home  Status is: Inpatient  Remains inpatient appropriate because:Inpatient level of care appropriate due to severity of illness Dispo: The patient is from: Home              Anticipated d/c is to: Home  Anticipated d/c date is: 3 days              Patient currently is not medically stable to d/c.   Consultants:   None   Procedures: None  Antimicrobials:  Anti-infectives (From admission, onward)   Start     Dose/Rate Route  Frequency Ordered Stop   09/17/20 1000  remdesivir 100 mg in sodium chloride 0.9 % 100 mL IVPB       "Followed by" Linked Group Details   100 mg 200 mL/hr over 30 Minutes Intravenous Daily 09/16/20 0154 09/21/20 0959   09/16/20 0200  remdesivir 200 mg in sodium chloride 0.9% 250 mL IVPB       "Followed by" Linked Group Details   200 mg 580 mL/hr over 30 Minutes Intravenous Once 09/16/20 0154 09/16/20 0254       Subjective: Pt is alert and oriented and denies any chest pain or sob or any complaints,except for iv hurting and wants it to be changed.   Objective: Vitals:   09/16/20 0230 09/16/20 0300 09/16/20 0329 09/16/20 0819  BP:   135/77 120/67  Pulse: 62  (!) 49 (!) 48  Resp:  18 16   Temp:   98.3 F (36.8 C)   TempSrc:   Oral   SpO2: 100%  100% 100%  Weight:      Height:        Intake/Output Summary (Last 24 hours) at 09/16/2020 1244 Last data filed at 09/16/2020 0600 Gross per 24 hour  Intake 444.06 ml  Output --  Net 444.06 ml   Filed Weights   09/15/20 1931  Weight: 128.8 kg   Examination: Blood pressure 120/67, pulse (!) 48, temperature 98.3 F (36.8 C), temperature source Oral, resp. rate 16, height 5\' 6"  (1.676 m), weight 128.8 kg, SpO2 100 %.  General exam: Appears calm and comfortable  Respiratory system: Clear to auscultation. Respiratory effort normal. Cardiovascular system: bradycardia RR. No JVD, murmurs, rubs, gallops or clicks. No pedal edema. Gastrointestinal system: Abdomen is nondistended, soft and nontender. No organomegaly or masses felt. Normal bowel sounds heard. Central nervous system: Alert and oriented. No focal neurological deficits. Extremities: pt moving all ext . Skin: No rashes, lesions or ulcers Psychiatry: Mood & affect appropriate.  Data Reviewed: I have personally reviewed following labs and imaging studies  I/O last 3 completed shifts: In: 444.1 [I.V.:154.1; IV Piggyback:290] Out: -  No intake/output data recorded. Lab  Results  Component Value Date   CREATININE 0.67 09/15/2020   CBC: Recent Labs  Lab 09/15/20 1937  WBC 3.3*  NEUTROABS 2.9  HGB 12.6  HCT 39.4  MCV 90.2  PLT 132*   Basic Metabolic Panel: Recent Labs  Lab 09/15/20 1937  NA 137  K 3.8  CL 106  CO2 26  GLUCOSE 146*  BUN 12  CREATININE 0.67  CALCIUM 8.3*   GFR: Estimated Creatinine Clearance: 111.8 mL/min (by C-G formula based on SCr of 0.67 mg/dL).  Recent Results (from the past 240 hour(s))  SARS CORONAVIRUS 2 (TAT 6-24 HRS) Nasopharyngeal Nasopharyngeal Swab     Status: Abnormal   Collection Time: 09/10/20  8:38 AM   Specimen: Nasopharyngeal Swab  Result Value Ref Range Status   SARS Coronavirus 2 POSITIVE (A) NEGATIVE Final    Comment: E MAIL M BROWING @0640  09/11/20 BY S GEZAHEGN (NOTE) SARS-CoV-2 target nucleic acids are DETECTED.  The SARS-CoV-2 RNA is generally detectable in upper and lower respiratory specimens during the acute phase of infection. Positive  results are indicative of the presence of SARS-CoV-2 RNA. Clinical correlation with patient history and other diagnostic information is  necessary to determine patient infection status. Positive results do not rule out bacterial infection or co-infection with other viruses.  The expected result is Negative.  Fact Sheet for Patients: HairSlick.nohttps://www.fda.gov/media/138098/download  Fact Sheet for Healthcare Providers: quierodirigir.comhttps://www.fda.gov/media/138095/download  This test is not yet approved or cleared by the [place] FDA and  has been authorized for detection and/or diagnosis of SARS-CoV-2 by FDA under an Emergency Use Authorization (EUA). This EUA will remain  in effect (meaning this test can be used) for the duration of the COVID-19 declara tion under Section 564(b)(1) of the Act, 21 U.S.C. section 360bbb-3(b)(1), unless the authorization is terminated or revoked sooner.   Performed at [facility], [address] [place],  [place] [id number]       Radiology Studies: CT Angio Chest PE W and/or Wo Contrast  Result Date: 09/16/2020 CLINICAL DATA:  PE suspected. Low/intermediate probability for pulmonary embolism. Positive D-dimer. EXAM: CT ANGIOGRAPHY CHEST WITH CONTRAST TECHNIQUE: Multidetector CT imaging of the chest was performed using the standard protocol during bolus administration of intravenous contrast. Multiplanar CT image reconstructions and MIPs were obtained to evaluate the vascular anatomy. CONTRAST:  100mL OMNIPAQUE IOHEXOL 350 MG/ML SOLN COMPARISON:  None. FINDINGS: Cardiovascular: Satisfactory opacification of the pulmonary arteries to the segmental level. No evidence of pulmonary embolism. Normal heart size. No pericardial effusion. Mediastinum/Nodes: Negative for adenopathy or mass Lungs/Pleura: Patchy ground-glass opacity throughout all lobes with intervening normal lung. No air leak Upper Abdomen: Gastric bypass. Increased soft tissue density posterior to the postoperative stomach is from varices based on a April 2021 abdominal CT. Musculoskeletal: Negative Review of the MIP images confirms the above findings. IMPRESSION: 1. COVID pneumonia. 2. Negative for pulmonary embolism. Electronically Signed   By: Marnee SpringJonathon  Watts M.D.   On: 09/16/2020 07:19   DG Chest Portable 1 View  Result Date: 09/16/2020 CLINICAL DATA:  Shortness of breath and COVID positive. EXAM: PORTABLE CHEST 1 VIEW COMPARISON:  12/29/2019 FINDINGS: Shallow inspiration. Cardiac enlargement. Patchy perihilar and basilar infiltrates consistent with multifocal COVID pneumonia. No pleural effusions. No pneumothorax. Mediastinal contours appear intact. IMPRESSION: Patchy perihilar and basilar infiltrates consistent with multifocal COVID pneumonia. Electronically Signed   By: Burman NievesWilliam  Stevens M.D.   On: 09/16/2020 02:15   Scheduled Meds: . ALPRAZolam  0.5 mg Oral QHS  . vitamin C  500 mg Oral Daily  . aspirin EC  81 mg Oral Daily  . baricitinib  4  mg Oral Daily  . cholecalciferol  1,000 Units Oral Daily  . DULoxetine  90 mg Oral Daily  . enoxaparin (LOVENOX) injection  65 mg Subcutaneous Q24H  . famotidine  20 mg Oral BID  . gabapentin  600 mg Oral q1800  . guaiFENesin  600 mg Oral BID  . [START ON 09/19/2020] predniSONE  50 mg Oral Daily  . sertraline  50 mg Oral QHS  . topiramate  200 mg Oral QHS  . traZODone  50 mg Oral QHS  . cyanocobalamin  1,000 mcg Oral Daily  . zinc sulfate  220 mg Oral Daily   Continuous Infusions: . sodium chloride Stopped (09/16/20 0516)  . methylPREDNISolone (SOLU-MEDROL) injection    . [START ON 09/17/2020] remdesivir 100 mg in NS 100 mL       LOS: 0 days    Gertha CalkinEkta V Fowler Antos, MD [facility] Pager [phone] If 7PM-7AM, please contact night-coverage [email or web address] Password  [email or web address] 09/16/2020, 12:44 PM

## 2020-09-16 NOTE — Progress Notes (Signed)
Anticoagulation monitoring(Lovenox):  54 yo female ordered Lovenox 40 mg Q24h  Filed Weights   09/15/20 1931  Weight: 128.8 kg (283 lb 15.2 oz)   BMI 45   Lab Results  Component Value Date   CREATININE 0.67 09/15/2020   Estimated Creatinine Clearance: 111.8 mL/min (by C-G formula based on SCr of 0.67 mg/dL). Hemoglobin & Hematocrit     Component Value Date/Time   HGB 12.6 09/15/2020 1937   HCT 39.4 09/15/2020 1937     Per Protocol for Covid Patient with estCrcl > 30 ml/min and BMI > 35, will transition to Lovenox 65 mg (.5 mg/kg) Q24h.

## 2020-09-16 NOTE — ED Provider Notes (Signed)
[facility] Emergency Department Provider Note  ____________________________________________  Time seen: Approximately 1:39 AM  I have reviewed the triage vital signs and the nursing notes.   HISTORY  Chief Complaint Medication Reaction   HPI Jill Garcia is a 54 y.o. female with a history of obesity, fibromyalgia, migraines, anxiety and depression who presents for evaluation of shortness of breath.  Patient was diagnosed with Covid 6 days ago.  She has had symptoms for a week.  Her symptoms include chills, fever, cough, sore throat, sinus congestion, shortness of breath, diarrhea.  She received her 1st monoclonal antibody infusion yesterday.  Since then she has become more short of breath.  She denies chest pain.  She denies personal or family history of blood clots, recent travel immobilization, leg pain or swelling, hemoptysis, exogenous hormones.  She is not a smoker.  No history of COPD or emphysema.   Patient is unvaccinated.  Past Medical History:  Diagnosis Date  . Anxiety   . Depression   . Fibromyalgia   . Insomnia   . Migraines   . Pseudotumor     Past Surgical History:  Procedure Laterality Date  . LEG SURGERY    . SMALL INTESTINE SURGERY      Prior to Admission medications   Medication Sig Start Date End Date Taking? Authorizing Provider  AIMOVIG 140 MG/ML SOAJ SMARTSIG:140 Milligram(s) SUB-Q Once a Month 08/31/20  Yes [provider]  ALPRAZolam (XANAX) 0.5 MG tablet Take 0.5 mg by mouth at bedtime.    Yes [provider]  chlorzoxazone (PARAFON) 500 MG tablet Take 500 mg by mouth every 6 (six) hours as needed for muscle spasms.  08/08/20  Yes [provider]  cyanocobalamin 1000 MCG tablet Take 1,000 mcg by mouth daily.   Yes [provider]  doxycycline (VIBRAMYCIN) 100 MG capsule Take 100 mg by mouth 2 (two) times daily. 09/14/20  Yes [provider]  DULoxetine (CYMBALTA) 60 MG capsule  Take 90 mg by mouth daily.   Yes [provider]  gabapentin (NEURONTIN) 300 MG capsule Take 300 mg by mouth as directed. Take 300 mg by mouth as directed Take in the evening as directed (Day 1: 300mg . Day 2: 600mg . Day 3-6: 900mg . Day 7-10: 1200mg . Day 11-14: 1500mg . Day 15+: 1800mg ) 08/01/20  Yes [provider]  sertraline (ZOLOFT) 50 MG tablet Take 50 mg by mouth at bedtime. 06/17/20  Yes [provider]  topiramate (TOPAMAX) 200 MG tablet Take 200 mg by mouth at bedtime. 08/02/20  Yes [provider]  traZODone (DESYREL) 50 MG tablet Take 50 mg by mouth at bedtime.    Yes [provider]  Ketorolac Tromethamine (SPRIX) 15.75 MG/SPRAY SOLN Use ONE psray IN each nostril EVERY 8 HOURS AS NEEDED for migraine for 5 days 02/10/20   [provider]  nitroGLYCERIN (NITROSTAT) 0.4 MG SL tablet SMARTSIG:1 Tablet(s) Sublingual PRN 04/03/20   [provider]  tiZANidine (ZANAFLEX) 2 MG tablet Take 2 mg by mouth 3 (three) times daily as needed. 05/12/20   [provider]    Allergies Tape and Clindamycin  No family history on file.  Social History Social History   Tobacco Use  . Smoking status: Never Smoker  . Smokeless tobacco: Never Used  Substance Use Topics  . Alcohol use: Never  . Drug use: Never    Review of Systems  Constitutional: + fever, chills Eyes: Negative for visual changes. ENT: Negative for sore throat. Neck: No  neck pain  Cardiovascular: Negative for chest pain. Respiratory: + shortness of breath, cough Gastrointestinal: Negative for abdominal pain, vomiting. + diarrhea. Genitourinary: Negative for dysuria. Musculoskeletal: Negative for back pain. Skin: Negative for rash. Neurological: Negative for headaches, weakness or numbness. Psych: No SI or HI  ____________________________________________   PHYSICAL EXAM:  VITAL SIGNS: ED Triage Vitals  Enc Vitals Group     BP 09/15/20 1956 113/72      Pulse Rate 09/15/20 1956 67     Resp 09/15/20 1956 (!) 26     Temp 09/15/20 1956 98.3 F (36.8 C)     Temp Source 09/15/20 1956 Oral     SpO2 09/15/20 1956 98 %     Weight 09/15/20 1931 283 lb 15.2 oz (128.8 kg)     Height 09/15/20 1931 5\' 6"  (1.676 m)     Head Circumference --      Peak Flow --      Pain Score 09/15/20 1931 8     Pain Loc --      Pain Edu? --      Excl. in GC? --     Constitutional: Alert and oriented. Well appearing and in no apparent distress. HEENT:      Head: Normocephalic and atraumatic.         Eyes: Conjunctivae are normal. Sclera is non-icteric.       Mouth/Throat: Mucous membranes are moist.       Neck: Supple with no signs of meningismus. Cardiovascular: Regular rate and rhythm. No murmurs, gallops, or rubs. 2+ symmetrical distal pulses are present in all extremities. No JVD. Respiratory: Patient is dyspneic and with mild increased work of breathing, tachypneic, satting in the mid 90s at rest but does drop her sats to 89% with minimal ambulation, lungs are clear with no wheezing or crackles  Gastrointestinal: Soft, non tender. Musculoskeletal: No edema, cyanosis, or erythema of extremities. Neurologic: Normal speech and language. Face is symmetric. Moving all extremities. No gross focal neurologic deficits are appreciated. Skin: Skin is warm, dry and intact. No rash noted. Psychiatric: Mood and affect are normal. Speech and behavior are normal.  ____________________________________________   LABS (all labs ordered are listed, but only abnormal results are displayed)  Labs Reviewed  BASIC METABOLIC PANEL - Abnormal; Notable for the following components:      Result Value   Glucose, Bld 146 (*)    Calcium 8.3 (*)    All other components within normal limits  CBC WITH DIFFERENTIAL/PLATELET - Abnormal; Notable for the following components:   WBC 3.3 (*)    Platelets 132 (*)    Lymphs Abs 0.4 (*)    All other components within normal limits   URINALYSIS, COMPLETE (UACMP) WITH MICROSCOPIC - Abnormal; Notable for the following components:   Color, Urine AMBER (*)    APPearance CLOUDY (*)    Specific Gravity, Urine 1.033 (*)    Protein, ur 100 (*)    Bacteria, UA RARE (*)    All other components within normal limits  FIBRIN DERIVATIVES D-DIMER ([facility] ONLY) - Abnormal; Notable for the following components:   Fibrin derivatives D-dimer ([facility]) 1,105.27 (*)    All other components within normal limits  EXPECTORATED SPUTUM ASSESSMENT W REFEX TO RESP CULTURE  HIV ANTIBODY (ROUTINE TESTING W REFLEX)  TROPONIN I (HIGH SENSITIVITY)   ____________________________________________  EKG  ED ECG REPORT I, 09/17/20, the attending physician, personally viewed and interpreted this ECG.  Normal sinus rhythm, rate of 64, normal intervals,  normal axis, no ST elevations or depressions. ____________________________________________  RADIOLOGY  I have personally reviewed the images performed during this visit and I agree with the Radiologist's read.   Interpretation by Radiologist:  DG Chest Portable 1 View  Result Date: 09/16/2020 CLINICAL DATA:  Shortness of breath and COVID positive. EXAM: PORTABLE CHEST 1 VIEW COMPARISON:  12/29/2019 FINDINGS: Shallow inspiration. Cardiac enlargement. Patchy perihilar and basilar infiltrates consistent with multifocal COVID pneumonia. No pleural effusions. No pneumothorax. Mediastinal contours appear intact. IMPRESSION: Patchy perihilar and basilar infiltrates consistent with multifocal COVID pneumonia. Electronically Signed   By: Burman Nieves M.D.   On: 09/16/2020 02:15     ____________________________________________   PROCEDURES  Procedure(s) performed:yes .1-3 Lead EKG Interpretation Performed by: Nita Sickle, MD Authorized by: Nita Sickle, MD     Interpretation: non-specific     ECG rate assessment: normal     Rhythm: sinus rhythm     Ectopy: none     Critical  Care performed: yes  CRITICAL CARE Performed by: Nita Sickle  ?  Total critical care time: 35 min  Critical care time was exclusive of separately billable procedures and treating other patients.  Critical care was necessary to treat or prevent imminent or life-threatening deterioration.  Critical care was time spent personally by me on the following activities: development of treatment plan with patient and/or surrogate as well as nursing, discussions with consultants, evaluation of patient's response to treatment, examination of patient, obtaining history from patient or surrogate, ordering and performing treatments and interventions, ordering and review of laboratory studies, ordering and review of radiographic studies, pulse oximetry and re-evaluation of patient's condition.  ____________________________________________   INITIAL IMPRESSION / ASSESSMENT AND PLAN / ED COURSE  54 y.o. female with a history of obesity, fibromyalgia, migraines, anxiety and depression who presents for evaluation of shortness of breath.  Tested positive for Covid 6 days ago.  Status post monoclonal antibody infusion x1 yesterday.  Patient has increased work of breathing, tachypneic, and desats to 89% with minimal ambulation.  Lungs are clear to auscultation.  Patient placed on 2 L nasal cannula.  Afebrile with normal pulse.  EKG with no signs of ischemia.  Differential diagnoses including Covid pneumonia versus PE versus myocarditis versus pericarditis.  Labs showing mild leukopenia which is seen with Covid patients with a white count of 3.3.  Mildly elevated blood glucose of 146 with no history of diabetes.  No other significant electrolyte derangements or signs of dehydration.  Will start patient on remdesivir, Solu-Medrol.  Will check troponin to rule out myocarditis.  Will check a D-dimer to rule out PE.  Will give Tylenol for body aches.  Patient placed on telemetry for close monitor.  Old medical  records reviewed.   _________________________ 3:23 AM on 09/16/2020 -----------------------------------------  D-dimer elevated. Patient has already gone up to her room. CTA ordered. Hospitalist made aware.     _____________________________________________ Please note:  Patient was evaluated in Emergency Department today for the symptoms described in the history of present illness. Patient was evaluated in the context of the global COVID-19 pandemic, which necessitated consideration that the patient might be at risk for infection with the SARS-CoV-2 virus that causes COVID-19. Institutional protocols and algorithms that pertain to the evaluation of patients at risk for COVID-19 are in a state of rapid change based on information released by regulatory bodies including the CDC and federal and state organizations. These policies and algorithms were followed during the patient's care in the ED.  Some  ED evaluations and interventions may be delayed as a result of limited staffing during the pandemic.   [place] Controlled Substance Database was reviewed by me. ____________________________________________   FINAL CLINICAL IMPRESSION(S) / ED DIAGNOSES   Final diagnoses:  Acute respiratory failure with hypoxia (HCC)  COVID-19      NEW MEDICATIONS STARTED DURING THIS VISIT:  ED Discharge Orders    None       Note:  This document was prepared using Dragon voice recognition software and may include unintentional dictation errors.    Don Perking, [place], MD 09/16/20 [phone]

## 2020-09-16 NOTE — Progress Notes (Signed)
Called by telemetry to inform of drop in BP from baseline HR ~60 to 46. Assessed patient- denies discomfort. denies SOB, chest pain. MD Allena Katz Notified.

## 2020-09-16 NOTE — Progress Notes (Signed)
Remdesivir - Pharmacy Brief Note   O:  ALT:  CXR:  SpO2: 98  % on RA    A/P:  Remdesivir 200 mg IVPB once followed by 100 mg IVPB daily x 4 days.   Jill Garcia D 09/16/2020 1:55 AM

## 2020-09-16 NOTE — ED Notes (Signed)
MD placed pt on 2lpm of oxygen while ambulating pt.

## 2020-09-17 LAB — CBC WITH DIFFERENTIAL/PLATELET
Abs Immature Granulocytes: 0.01 10*3/uL (ref 0.00–0.07)
Basophils Absolute: 0 10*3/uL (ref 0.0–0.1)
Basophils Relative: 0 %
Eosinophils Absolute: 0 10*3/uL (ref 0.0–0.5)
Eosinophils Relative: 0 %
HCT: 35.3 % — ABNORMAL LOW (ref 36.0–46.0)
Hemoglobin: 11.9 g/dL — ABNORMAL LOW (ref 12.0–15.0)
Immature Granulocytes: 0 %
Lymphocytes Relative: 20 %
Lymphs Abs: 0.5 10*3/uL — ABNORMAL LOW (ref 0.7–4.0)
MCH: 29.6 pg (ref 26.0–34.0)
MCHC: 33.7 g/dL (ref 30.0–36.0)
MCV: 87.8 fL (ref 80.0–100.0)
Monocytes Absolute: 0.1 10*3/uL (ref 0.1–1.0)
Monocytes Relative: 6 %
Neutro Abs: 1.7 10*3/uL (ref 1.7–7.7)
Neutrophils Relative %: 74 %
Platelets: 151 10*3/uL (ref 150–400)
RBC: 4.02 MIL/uL (ref 3.87–5.11)
RDW: 12.7 % (ref 11.5–15.5)
WBC: 2.3 10*3/uL — ABNORMAL LOW (ref 4.0–10.5)
nRBC: 0 % (ref 0.0–0.2)

## 2020-09-17 LAB — FIBRIN DERIVATIVES D-DIMER (ARMC ONLY): Fibrin derivatives D-dimer (ARMC): 664.92 ng/mL (FEU) — ABNORMAL HIGH (ref 0.00–499.00)

## 2020-09-17 LAB — LACTATE DEHYDROGENASE: LDH: 258 U/L — ABNORMAL HIGH (ref 98–192)

## 2020-09-17 LAB — COMPREHENSIVE METABOLIC PANEL
ALT: 17 U/L (ref 0–44)
AST: 24 U/L (ref 15–41)
Albumin: 2.9 g/dL — ABNORMAL LOW (ref 3.5–5.0)
Alkaline Phosphatase: 66 U/L (ref 38–126)
Anion gap: 9 (ref 5–15)
BUN: 17 mg/dL (ref 6–20)
CO2: 23 mmol/L (ref 22–32)
Calcium: 8.1 mg/dL — ABNORMAL LOW (ref 8.9–10.3)
Chloride: 108 mmol/L (ref 98–111)
Creatinine, Ser: 0.79 mg/dL (ref 0.44–1.00)
GFR calc Af Amer: >60 mL/min (ref >60)
GFR calc non Af Amer: >60 mL/min (ref >60)
Glucose, Bld: 156 mg/dL — ABNORMAL HIGH (ref 70–99)
Potassium: 3.8 mmol/L (ref 3.5–5.1)
Sodium: 140 mmol/L (ref 135–145)
Total Bilirubin: 0.5 mg/dL (ref 0.3–1.2)
Total Protein: 6.1 g/dL — ABNORMAL LOW (ref 6.5–8.1)

## 2020-09-17 LAB — FERRITIN: Ferritin: 147 ng/mL (ref 11–307)

## 2020-09-17 LAB — C-REACTIVE PROTEIN: CRP: 2.8 mg/dL — ABNORMAL HIGH (ref <1.0)

## 2020-09-17 LAB — CK: Total CK: 88 U/L (ref 38–234)

## 2020-09-17 NOTE — Discharge Instructions (Addendum)
 COVID-19 COVID-19 is a respiratory infection that is caused by a virus called severe acute respiratory syndrome coronavirus 2 (SARS-CoV-2). The disease is also known as coronavirus disease or novel coronavirus. In some people, the virus may not cause any symptoms. In others, it may cause a serious infection. The infection can get worse quickly and can lead to complications, such as:  Pneumonia, or infection of the lungs.  Acute respiratory distress syndrome or ARDS. This is a condition in which fluid build-up in the lungs prevents the lungs from filling with air and passing oxygen into the blood.  Acute respiratory failure. This is a condition in which there is not enough oxygen passing from the lungs to the body or when carbon dioxide is not passing from the lungs out of the body.  Sepsis or septic shock. This is a serious bodily reaction to an infection.  Blood clotting problems.  Secondary infections due to bacteria or fungus.  Organ failure. This is when your body's organs stop working. The virus that causes COVID-19 is contagious. This means that it can spread from person to person through droplets from coughs and sneezes (respiratory secretions). What are the causes? This illness is caused by a virus. You may catch the virus by:  Breathing in droplets from an infected person. Droplets can be spread by a person breathing, speaking, singing, coughing, or sneezing.  Touching something, like a table or a doorknob, that was exposed to the virus (contaminated) and then touching your mouth, nose, or eyes. What increases the risk? Risk for infection You are more likely to be infected with this virus if you:  Are within 6 feet (2 meters) of a person with COVID-19.  Provide care for or live with a person who is infected with COVID-19.  Spend time in crowded indoor spaces or live in shared housing. Risk for serious illness You are more likely to become seriously ill from the virus if  you:  Are 50 years of age or older. The higher your age, the more you are at risk for serious illness.  Live in a nursing home or long-term care facility.  Have cancer.  Have a long-term (chronic) disease such as: ? Chronic lung disease, including chronic obstructive pulmonary disease or asthma. ? A long-term disease that lowers your body's ability to fight infection (immunocompromised). ? Heart disease, including heart failure, a condition in which the arteries that lead to the heart become narrow or blocked (coronary artery disease), a disease which makes the heart muscle thick, weak, or stiff (cardiomyopathy). ? Diabetes. ? Chronic kidney disease. ? Sickle cell disease, a condition in which red blood cells have an abnormal "sickle" shape. ? Liver disease.  Are obese. What are the signs or symptoms? Symptoms of this condition can range from mild to severe. Symptoms may appear any time from 2 to 14 days after being exposed to the virus. They include:  A fever or chills.  A cough.  Difficulty breathing.  Headaches, body aches, or muscle aches.  Runny or stuffy (congested) nose.  A sore throat.  New loss of taste or smell. Some people may also have stomach problems, such as nausea, vomiting, or diarrhea. Other people may not have any symptoms of COVID-19. How is this diagnosed? This condition may be diagnosed based on:  Your signs and symptoms, especially if: ? You live in an area with a COVID-19 outbreak. ? You recently traveled to or from an area where the virus is common. ?   You provide care for or live with a person who was diagnosed with COVID-19. ? You were exposed to a person who was diagnosed with COVID-19.  A physical exam.  Lab tests, which may include: ? Taking a sample of fluid from the back of your nose and throat (nasopharyngeal fluid), your nose, or your throat using a swab. ? A sample of mucus from your lungs (sputum). ? Blood tests.  Imaging tests,  which may include, X-rays, CT scan, or ultrasound. How is this treated? At present, there is no medicine to treat COVID-19. Medicines that treat other diseases are being used on a trial basis to see if they are effective against COVID-19. Your health care provider will talk with you about ways to treat your symptoms. For most people, the infection is mild and can be managed at home with rest, fluids, and over-the-counter medicines. Treatment for a serious infection usually takes places in a hospital intensive care unit (ICU). It may include one or more of the following treatments. These treatments are given until your symptoms improve.  Receiving fluids and medicines through an IV.  Supplemental oxygen. Extra oxygen is given through a tube in the nose, a face mask, or a hood.  Positioning you to lie on your stomach (prone position). This makes it easier for oxygen to get into the lungs.  Continuous positive airway pressure (CPAP) or bi-level positive airway pressure (BPAP) machine. This treatment uses mild air pressure to keep the airways open. A tube that is connected to a motor delivers oxygen to the body.  Ventilator. This treatment moves air into and out of the lungs by using a tube that is placed in your windpipe.  Tracheostomy. This is a procedure to create a hole in the neck so that a breathing tube can be inserted.  Extracorporeal membrane oxygenation (ECMO). This procedure gives the lungs a chance to recover by taking over the functions of the heart and lungs. It supplies oxygen to the body and removes carbon dioxide. Follow these instructions at home: Lifestyle  If you are sick, stay home except to get medical care. Your health care provider will tell you how long to stay home. Call your health care provider before you go for medical care.  Rest at home as told by your health care provider.  Do not use any products that contain nicotine or tobacco, such as cigarettes,  e-cigarettes, and chewing tobacco. If you need help quitting, ask your health care provider.  Return to your normal activities as told by your health care provider. Ask your health care provider what activities are safe for you. General instructions  Take over-the-counter and prescription medicines only as told by your health care provider.  Drink enough fluid to keep your urine pale yellow.  Keep all follow-up visits as told by your health care provider. This is important. How is this prevented?  There is no vaccine to help prevent COVID-19 infection. However, there are steps you can take to protect yourself and others from this virus. To protect yourself:   Do not travel to areas where COVID-19 is a risk. The areas where COVID-19 is reported change often. To identify high-risk areas and travel restrictions, check the CDC travel website: wwwnc.cdc.gov/travel/notices  If you live in, or must travel to, an area where COVID-19 is a risk, take precautions to avoid infection. ? Stay away from people who are sick. ? Wash your hands often with soap and water for 20 seconds. If soap and   water are not available, use an alcohol-based hand sanitizer. ? Avoid touching your mouth, face, eyes, or nose. ? Avoid going out in public, follow guidance from your state and local health authorities. ? If you must go out in public, wear a cloth face covering or face mask. Make sure your mask covers your nose and mouth. ? Avoid crowded indoor spaces. Stay at least 6 feet (2 meters) away from others. ? Disinfect objects and surfaces that are frequently touched every day. This may include:  Counters and tables.  Doorknobs and light switches.  Sinks and faucets.  Electronics, such as phones, remote controls, keyboards, computers, and tablets. To protect others: If you have symptoms of COVID-19, take steps to prevent the virus from spreading to others.  If you think you have a COVID-19 infection, contact  your health care provider right away. Tell your health care team that you think you may have a COVID-19 infection.  Stay home. Leave your house only to seek medical care. Do not use public transport.  Do not travel while you are sick.  Wash your hands often with soap and water for 20 seconds. If soap and water are not available, use alcohol-based hand sanitizer.  Stay away from other members of your household. Let healthy household members care for children and pets, if possible. If you have to care for children or pets, wash your hands often and wear a mask. If possible, stay in your own room, separate from others. Use a different bathroom.  Make sure that all people in your household wash their hands well and often.  Cough or sneeze into a tissue or your sleeve or elbow. Do not cough or sneeze into your hand or into the air.  Wear a cloth face covering or face mask. Make sure your mask covers your nose and mouth. Where to find more information  Centers for Disease Control and Prevention: www.cdc.gov/coronavirus/2019-ncov/index.html  World Health Organization: www.who.int/health-topics/coronavirus Contact a health care provider if:  You live in or have traveled to an area where COVID-19 is a risk and you have symptoms of the infection.  You have had contact with someone who has COVID-19 and you have symptoms of the infection. Get help right away if:  You have trouble breathing.  You have pain or pressure in your chest.  You have confusion.  You have bluish lips and fingernails.  You have difficulty waking from sleep.  You have symptoms that get worse. These symptoms may represent a serious problem that is an emergency. Do not wait to see if the symptoms will go away. Get medical help right away. Call your local emergency services (911 in the [place] Do not drive yourself to the hospital. Let the emergency medical personnel know if you think you have  COVID-19. Summary  COVID-19 is a respiratory infection that is caused by a virus. It is also known as coronavirus disease or novel coronavirus. It can cause serious infections, such as pneumonia, acute respiratory distress syndrome, acute respiratory failure, or sepsis.  The virus that causes COVID-19 is contagious. This means that it can spread from person to person through droplets from breathing, speaking, singing, coughing, or sneezing.  You are more likely to develop a serious illness if you are 50 years of age or older, have a weak immune system, live in a nursing home, or have chronic disease.  There is no medicine to treat COVID-19. Your health care provider will talk with you about ways to treat your   symptoms.  Take steps to protect yourself and others from infection. Wash your hands often and disinfect objects and surfaces that are frequently touched every day. Stay away from people who are sick and wear a mask if you are sick. This information is not intended to replace advice given to you by your health care provider. Make sure you discuss any questions you have with your health care provider. Document Revised: 10/07/2019 Document Reviewed: 01/13/2019 Elsevier Patient Education  2020 Elsevier Inc.    Person Under Monitoring Name: Jill Garcia  Location: [address] [place] [address]   Infection Prevention Recommendations for Individuals Confirmed to have, or Being Evaluated for, 2019 Novel Coronavirus (COVID-19) Infection Who Receive Care at Home  Individuals who are confirmed to have, or are being evaluated for, COVID-19 should follow the prevention steps below until a healthcare provider or local or state health department says they can return to normal activities.  Stay home except to get medical care You should restrict activities outside your home, except for getting medical care. Do not go to work, school, or public areas, and do not use public  transportation or taxis.  Call ahead before visiting your doctor Before your medical appointment, call the healthcare provider and tell them that you have, or are being evaluated for, COVID-19 infection. This will help the healthcare provider's office take steps to keep other people from getting infected. Ask your healthcare provider to call the local or state health department.  Monitor your symptoms Seek prompt medical attention if your illness is worsening (e.g., difficulty breathing). Before going to your medical appointment, call the healthcare provider and tell them that you have, or are being evaluated for, COVID-19 infection. Ask your healthcare provider to call the local or state health department.  Wear a facemask You should wear a facemask that covers your nose and mouth when you are in the same room with other people and when you visit a healthcare provider. People who live with or visit you should also wear a facemask while they are in the same room with you.  Separate yourself from other people in your home As much as possible, you should stay in a different room from other people in your home. Also, you should use a separate bathroom, if available.  Avoid sharing household items You should not share dishes, drinking glasses, cups, eating utensils, towels, bedding, or other items with other people in your home. After using these items, you should wash them thoroughly with soap and water.  Cover your coughs and sneezes Cover your mouth and nose with a tissue when you cough or sneeze, or you can cough or sneeze into your sleeve. Throw used tissues in a lined trash can, and immediately wash your hands with soap and water for at least 20 seconds or use an alcohol-based hand rub.  Wash your [employer] your hands often and thoroughly with soap and water for at least 20 seconds. You can use an alcohol-based hand sanitizer if soap and water are not available and if your hands  are not visibly dirty. Avoid touching your eyes, nose, and mouth with unwashed hands.   Prevention Steps for Caregivers and Household Members of Individuals Confirmed to have, or Being Evaluated for, COVID-19 Infection Being Cared for in the Home  If you live with, or provide care at home for, a person confirmed to have, or being evaluated for, COVID-19 infection please follow these guidelines to prevent infection:  Follow healthcare provider's instructions  Make sure that you understand and can help the patient follow any healthcare provider instructions for all care.  Provide for the patient's basic needs You should help the patient with basic needs in the home and provide support for getting groceries, prescriptions, and other personal needs.  Monitor the patient's symptoms If they are getting sicker, call his or her medical provider and tell them that the patient has, or is being evaluated for, COVID-19 infection. This will help the healthcare provider's office take steps to keep other people from getting infected. Ask the healthcare provider to call the local or state health department.  Limit the number of people who have contact with the patient  If possible, have only one caregiver for the patient.  Other household members should stay in another home or place of residence. If this is not possible, they should stay  in another room, or be separated from the patient as much as possible. Use a separate bathroom, if available.  Restrict visitors who do not have an essential need to be in the home.  Keep older adults, very young children, and other sick people away from the patient Keep older adults, very young children, and those who have compromised immune systems or chronic health conditions away from the patient. This includes people with chronic heart, lung, or kidney conditions, diabetes, and cancer.  Ensure good ventilation Make sure that shared spaces in the home have  good air flow, such as from an air conditioner or an opened window, weather permitting.  Wash your hands often  Wash your hands often and thoroughly with soap and water for at least 20 seconds. You can use an alcohol based hand sanitizer if soap and water are not available and if your hands are not visibly dirty.  Avoid touching your eyes, nose, and mouth with unwashed hands.  Use disposable paper towels to dry your hands. If not available, use dedicated cloth towels and replace them when they become wet.  Wear a facemask and gloves  Wear a disposable facemask at all times in the room and gloves when you touch or have contact with the patient's blood, body fluids, and/or secretions or excretions, such as sweat, saliva, sputum, nasal mucus, vomit, urine, or feces.  Ensure the mask fits over your nose and mouth tightly, and do not touch it during use.  Throw out disposable facemasks and gloves after using them. Do not reuse.  Wash your hands immediately after removing your facemask and gloves.  If your personal clothing becomes contaminated, carefully remove clothing and launder. Wash your hands after handling contaminated clothing.  Place all used disposable facemasks, gloves, and other waste in a lined container before disposing them with other household waste.  Remove gloves and wash your hands immediately after handling these items.  Do not share dishes, glasses, or other household items with the patient  Avoid sharing household items. You should not share dishes, drinking glasses, cups, eating utensils, towels, bedding, or other items with a patient who is confirmed to have, or being evaluated for, COVID-19 infection.  After the person uses these items, you should wash them thoroughly with soap and water.  Wash laundry thoroughly  Immediately remove and wash clothes or bedding that have blood, body fluids, and/or secretions or excretions, such as sweat, saliva, sputum, nasal  mucus, vomit, urine, or feces, on them.  Wear gloves when handling laundry from the patient.  Read and follow directions on labels of laundry or clothing items and detergent.  In general, wash and dry with the warmest temperatures recommended on the label.  Clean all areas the individual has used often  Clean all touchable surfaces, such as counters, tabletops, doorknobs, bathroom fixtures, toilets, phones, keyboards, tablets, and bedside tables, every day. Also, clean any surfaces that may have blood, body fluids, and/or secretions or excretions on them.  Wear gloves when cleaning surfaces the patient has come in contact with.  Use a diluted bleach solution (e.g., dilute bleach with 1 part bleach and 10 parts water) or a household disinfectant with a label that says EPA-registered for coronaviruses. To make a bleach solution at home, add 1 tablespoon of bleach to 1 quart (4 cups) of water. For a larger supply, add  cup of bleach to 1 gallon (16 cups) of water.  Read labels of cleaning products and follow recommendations provided on product labels. Labels contain instructions for safe and effective use of the cleaning product including precautions you should take when applying the product, such as wearing gloves or eye protection and making sure you have good ventilation during use of the product.  Remove gloves and wash hands immediately after cleaning.  Monitor yourself for signs and symptoms of illness Caregivers and household members are considered close contacts, should monitor their health, and will be asked to limit movement outside of the home to the extent possible. Follow the monitoring steps for close contacts listed on the symptom monitoring form.   ? If you have additional questions, contact your local health department or call the epidemiologist on call at [phone] (available 24/7). ? This guidance is subject to change. For the most up-to-date guidance from [facility], please  refer to their website: https://jones-murray.org/https://www.cdc.gov/coronavirus/2019-ncov/hcp/guidance-prevent-spread.html10 Things You Can Do to Manage Your COVID-19 Symptoms at Home If you have possible or confirmed COVID-19: 1. Stay home from work and school. And stay away from other public places. If you must go out, avoid using any kind of public transportation, ridesharing, or taxis. 2. Monitor your symptoms carefully. If your symptoms get worse, call your healthcare provider immediately. 3. Get rest and stay hydrated. 4. If you have a medical appointment, call the healthcare provider ahead of time and tell them that you have or may have COVID-19. 5. For medical emergencies, call 911 and notify the dispatch personnel that you have or may have COVID-19. 6. Cover your cough and sneezes with a tissue or use the inside of your elbow. 7. Wash your hands often with soap and water for at least 20 seconds or clean your hands with an alcohol-based hand sanitizer that contains at least 60% alcohol. 8. As much as possible, stay in a specific room and away from other people in your home. Also, you should use a separate bathroom, if available. If you need to be around other people in or outside of the home, wear a mask. 9. Avoid sharing personal items with other people in your household, like dishes, towels, and bedding. 10. Clean all surfaces that are touched often, like counters, tabletops, and doorknobs. Use household cleaning sprays or wipes according to the label instructions. [employer].co.[place]c.gov/coronavirus 06/22/2019 This information is not intended to replace advice given to you by your health care provider. Make sure you discuss any questions you have with your health care provider. Document Revised: 11/24/2019 Document Reviewed: 11/24/2019 Elsevier Patient Education  2020 Else[employer] Inc.   Acute Respiratory Failure, Adult  Acute respiratory failure occurs when there is not enough oxygen passing from your lungs to your body.  When  this happens, your lungs have trouble removing carbon dioxide from the blood. This causes your blood oxygen level to drop too low as carbon dioxide builds up. Acute respiratory failure is a medical emergency. It can develop quickly, but it is temporary if treated promptly. Your lung capacity, or how much air your lungs can hold, may improve with time, exercise, and treatment. What are the causes? There are many possible causes of acute respiratory failure, including:  Lung injury.  Chest injury or damage to the ribs or tissues near the lungs.  Lung conditions that affect the flow of air and blood into and out of the lungs, such as pneumonia, acute respiratory distress syndrome, and cystic fibrosis.  Medical conditions, such as strokes or spinal cord injuries, that affect the muscles and nerves that control breathing.  Blood infection (sepsis).  Inflammation of the pancreas (pancreatitis).  A blood clot in the lungs (pulmonary embolism).  A large-volume blood transfusion.  Burns.  Near-drowning.  Seizure.  Smoke inhalation.  Reaction to medicines.  Alcohol or drug overdose. What increases the risk? This condition is more likely to develop in people who have:  A blocked airway.  Asthma.  A condition or disease that damages or weakens the muscles, nerves, bones, or tissues that are involved in breathing.  A serious infection.  A health problem that blocks the unconscious reflex that is involved in breathing, such as hypothyroidism or sleep apnea.  A lung injury or trauma. What are the signs or symptoms? Trouble breathing is the main symptom of acute respiratory failure. Symptoms may also include:  Rapid breathing.  Restlessness or anxiety.  Skin, lips, or fingernails that appear blue (cyanosis).  Rapid heart rate.  Abnormal heart rhythms (arrhythmias).  Confusion or changes in behavior.  Tiredness or loss of energy.  Feeling sleepy or having a loss of  consciousness. How is this diagnosed? Your health care provider can diagnose acute respiratory failure with a medical history and physical exam. During the exam, your health care provider will listen to your heart and check for crackling or wheezing sounds in your lungs. Your may also have tests to confirm the diagnosis and determine what is causing respiratory failure. These tests may include:  Measuring the amount of oxygen in your blood (pulse oximetry). The measurement comes from a small device that is placed on your finger, earlobe, or toe.  Other blood tests to measure blood gases and to look for signs of infection.  Sampling your cerebral spinal fluid or tracheal fluid to check for infections.  Chest X-ray to look for fluid in spaces that should be filled with air.  Electrocardiogram (ECG) to look at the heart's electrical activity. How is this treated? Treatment for this condition usually takes places in a hospital intensive care unit (ICU). Treatment depends on what is causing the condition. It may include one or more treatments until your symptoms improve. Treatment may include:  Supplemental oxygen. Extra oxygen is given through a tube in the nose, a face mask, or a hood.  A device such as a continuous positive airway pressure (CPAP) or bi-level positive airway pressure (BiPAP or BPAP) machine. This treatment uses mild air pressure to keep the airways open. A mask or other device will be placed over your nose or mouth. A tube that is connected to a motor will deliver oxygen through the mask.  Ventilator. This treatment helps move air into and out of the lungs. This may be done with a bag and mask  or a machine. For this treatment, a tube is placed in your windpipe (trachea) so air and oxygen can flow to the lungs.  Extracorporeal membrane oxygenation (ECMO). This treatment temporarily takes over the function of the heart and lungs, supplying oxygen and removing carbon dioxide. ECMO  gives the lungs a chance to recover. It may be used if a ventilator is not effective.  Tracheostomy. This is a procedure that creates a hole in the neck to insert a breathing tube.  Receiving fluids and medicines.  Rocking the bed to help breathing. Follow these instructions at home:  Take over-the-counter and prescription medicines only as told by your health care provider.  Return to normal activities as told by your health care provider. Ask your health care provider what activities are safe for you.  Keep all follow-up visits as told by your health care provider. This is important. How is this prevented? Treating infections and medical conditions that may lead to acute respiratory failure can help prevent the condition from developing. Contact a health care provider if:  You have a fever.  Your symptoms do not improve or they get worse. Get help right away if:  You are having trouble breathing.  You lose consciousness.  Your have cyanosis or turn blue.  You develop a rapid heart rate.  You are confused. These symptoms may represent a serious problem that is an emergency. Do not wait to see if the symptoms will go away. Get medical help right away. Call your local emergency services (911 in the [place] Do not drive yourself to the hospital. This information is not intended to replace advice given to you by your health care provider. Make sure you discuss any questions you have with your health care provider. Document Revised: 11/20/2017 Document Reviewed: 06/25/2016 Elsevier Patient Education  2020 [employer] COVID-19 COVID-19 is a respiratory infection that is caused by a virus called severe acute respiratory syndrome coronavirus 2 (SARS-CoV-2). The disease is also known as coronavirus disease or novel coronavirus. In some people, the virus may not cause any symptoms. In others, it may cause a serious infection. The infection can get worse quickly and can lead to  complications, such as:  Pneumonia, or infection of the lungs.  Acute respiratory distress syndrome or ARDS. This is a condition in which fluid build-up in the lungs prevents the lungs from filling with air and passing oxygen into the blood.  Acute respiratory failure. This is a condition in which there is not enough oxygen passing from the lungs to the body or when carbon dioxide is not passing from the lungs out of the body.  Sepsis or septic shock. This is a serious bodily reaction to an infection.  Blood clotting problems.  Secondary infections due to bacteria or fungus.  Organ failure. This is when your body's organs stop working. The virus that causes COVID-19 is contagious. This means that it can spread from person to person through droplets from coughs and sneezes (respiratory secretions). What are the causes? This illness is caused by a virus. You may catch the virus by:  Breathing in droplets from an infected person. Droplets can be spread by a person breathing, speaking, singing, coughing, or sneezing.  Touching something, like a table or a doorknob, that was exposed to the virus (contaminated) and then touching your mouth, nose, or eyes. What increases the risk? Risk for infection You are more likely to be infected with this virus if you:  Are within 6 feet (2  meters) of a person with COVID-19.  Provide care for or live with a person who is infected with COVID-19.  Spend time in crowded indoor spaces or live in shared housing. Risk for serious illness You are more likely to become seriously ill from the virus if you:  Are 18 years of age or older. The higher your age, the more you are at risk for serious illness.  Live in a nursing home or long-term care facility.  Have cancer.  Have a long-term (chronic) disease such as: ? Chronic lung disease, including chronic obstructive pulmonary disease or asthma. ? A long-term disease that lowers your body's ability to  fight infection (immunocompromised). ? Heart disease, including heart failure, a condition in which the arteries that lead to the heart become narrow or blocked (coronary artery disease), a disease which makes the heart muscle thick, weak, or stiff (cardiomyopathy). ? Diabetes. ? Chronic kidney disease. ? Sickle cell disease, a condition in which red blood cells have an abnormal "sickle" shape. ? Liver disease.  Are obese. What are the signs or symptoms? Symptoms of this condition can range from mild to severe. Symptoms may appear any time from 2 to 14 days after being exposed to the virus. They include:  A fever or chills.  A cough.  Difficulty breathing.  Headaches, body aches, or muscle aches.  Runny or stuffy (congested) nose.  A sore throat.  New loss of taste or smell. Some people may also have stomach problems, such as nausea, vomiting, or diarrhea. Other people may not have any symptoms of COVID-19. How is this diagnosed? This condition may be diagnosed based on:  Your signs and symptoms, especially if: ? You live in an area with a COVID-19 outbreak. ? You recently traveled to or from an area where the virus is common. ? You provide care for or live with a person who was diagnosed with COVID-19. ? You were exposed to a person who was diagnosed with COVID-19.  A physical exam.  Lab tests, which may include: ? Taking a sample of fluid from the back of your nose and throat (nasopharyngeal fluid), your nose, or your throat using a swab. ? A sample of mucus from your lungs (sputum). ? Blood tests.  Imaging tests, which may include, X-rays, CT scan, or ultrasound. How is this treated? At present, there is no medicine to treat COVID-19. Medicines that treat other diseases are being used on a trial basis to see if they are effective against COVID-19. Your health care provider will talk with you about ways to treat your symptoms. For most people, the infection is mild and  can be managed at home with rest, fluids, and over-the-counter medicines. Treatment for a serious infection usually takes places in a hospital intensive care unit (ICU). It may include one or more of the following treatments. These treatments are given until your symptoms improve.  Receiving fluids and medicines through an IV.  Supplemental oxygen. Extra oxygen is given through a tube in the nose, a face mask, or a hood.  Positioning you to lie on your stomach (prone position). This makes it easier for oxygen to get into the lungs.  Continuous positive airway pressure (CPAP) or bi-level positive airway pressure (BPAP) machine. This treatment uses mild air pressure to keep the airways open. A tube that is connected to a motor delivers oxygen to the body.  Ventilator. This treatment moves air into and out of the lungs by using a tube that is  placed in your windpipe.  Tracheostomy. This is a procedure to create a hole in the neck so that a breathing tube can be inserted.  Extracorporeal membrane oxygenation (ECMO). This procedure gives the lungs a chance to recover by taking over the functions of the heart and lungs. It supplies oxygen to the body and removes carbon dioxide. Follow these instructions at home: Lifestyle  If you are sick, stay home except to get medical care. Your health care provider will tell you how long to stay home. Call your health care provider before you go for medical care.  Rest at home as told by your health care provider.  Do not use any products that contain nicotine or tobacco, such as cigarettes, e-cigarettes, and chewing tobacco. If you need help quitting, ask your health care provider.  Return to your normal activities as told by your health care provider. Ask your health care provider what activities are safe for you. General instructions  Take over-the-counter and prescription medicines only as told by your health care provider.  Drink enough fluid to keep  your urine pale yellow.  Keep all follow-up visits as told by your health care provider. This is important. How is this prevented?  There is no vaccine to help prevent COVID-19 infection. However, there are steps you can take to protect yourself and others from this virus. To protect yourself:   Do not travel to areas where COVID-19 is a risk. The areas where COVID-19 is reported change often. To identify high-risk areas and travel restrictions, check the CDC travel website: StageSync.si  If you live in, or must travel to, an area where COVID-19 is a risk, take precautions to avoid infection. ? Stay away from people who are sick. ? Wash your hands often with soap and water for 20 seconds. If soap and water are not available, use an alcohol-based hand sanitizer. ? Avoid touching your mouth, face, eyes, or nose. ? Avoid going out in public, follow guidance from your state and local health authorities. ? If you must go out in public, wear a cloth face covering or face mask. Make sure your mask covers your nose and mouth. ? Avoid crowded indoor spaces. Stay at least 6 feet (2 meters) away from others. ? Disinfect objects and surfaces that are frequently touched every day. This may include:  Counters and tables.  Doorknobs and light switches.  Sinks and faucets.  Electronics, such as phones, remote controls, keyboards, computers, and tablets. To protect others: If you have symptoms of COVID-19, take steps to prevent the virus from spreading to others.  If you think you have a COVID-19 infection, contact your health care provider right away. Tell your health care team that you think you may have a COVID-19 infection.  Stay home. Leave your house only to seek medical care. Do not use public transport.  Do not travel while you are sick.  Wash your hands often with soap and water for 20 seconds. If soap and water are not available, use alcohol-based hand sanitizer.  Stay  away from other members of your household. Let healthy household members care for children and pets, if possible. If you have to care for children or pets, wash your hands often and wear a mask. If possible, stay in your own room, separate from others. Use a different bathroom.  Make sure that all people in your household wash their hands well and often.  Cough or sneeze into a tissue or your sleeve or elbow.  Do not cough or sneeze into your hand or into the air.  Wear a cloth face covering or face mask. Make sure your mask covers your nose and mouth. Where to find more information  Centers for Disease Control and Prevention: StickerEmporium.tn  World Health Organization: https://thompson-craig.com/ Contact a health care provider if:  You live in or have traveled to an area where COVID-19 is a risk and you have symptoms of the infection.  You have had contact with someone who has COVID-19 and you have symptoms of the infection. Get help right away if:  You have trouble breathing.  You have pain or pressure in your chest.  You have confusion.  You have bluish lips and fingernails.  You have difficulty waking from sleep.  You have symptoms that get worse. These symptoms may represent a serious problem that is an emergency. Do not wait to see if the symptoms will go away. Get medical help right away. Call your local emergency services (911 in the [place] Do not drive yourself to the hospital. Let the emergency medical personnel know if you think you have COVID-19. Summary  COVID-19 is a respiratory infection that is caused by a virus. It is also known as coronavirus disease or novel coronavirus. It can cause serious infections, such as pneumonia, acute respiratory distress syndrome, acute respiratory failure, or sepsis.  The virus that causes COVID-19 is contagious. This means that it can spread from person to person through droplets from breathing,  speaking, singing, coughing, or sneezing.  You are more likely to develop a serious illness if you are 52 years of age or older, have a weak immune system, live in a nursing home, or have chronic disease.  There is no medicine to treat COVID-19. Your health care provider will talk with you about ways to treat your symptoms.  Take steps to protect yourself and others from infection. Wash your hands often and disinfect objects and surfaces that are frequently touched every day. Stay away from people who are sick and wear a mask if you are sick. This information is not intended to replace advice given to you by your health care provider. Make sure you discuss any questions you have with your health care provider. Document Revised: 10/07/2019 Document Reviewed: 01/13/2019 Elsevier Patient Education  2020 Else[employer] Inc.  COVID-19: How to Protect Yourself and Others Know how it spreads  There is currently no vaccine to prevent coronavirus disease 2019 (COVID-19).  The best way to prevent illness is to avoid being exposed to this virus.  The virus is thought to spread mainly from person-to-person. ? Between people who are in close contact with one another (within about 6 feet). ? Through respiratory droplets produced when an infected person coughs, sneezes or talks. ? These droplets can land in the mouths or noses of people who are nearby or possibly be inhaled into the lungs. ? COVID-19 may be spread by people who are not showing symptoms. Everyone should Clean your hands often  Wash your hands often with soap and water for at least 20 seconds especially after you have been in a public place, or after blowing your nose, coughing, or sneezing.  If soap and water are not readily available, use a hand sanitizer that contains at least 60% alcohol. Cover all surfaces of your hands and rub them together until they feel dry.  Avoid touching your eyes, nose, and mouth with unwashed hands. Avoid  close contact  Limit contact with others as much  as possible.  Avoid close contact with people who are sick.  Put distance between yourself and other people. ? Remember that some people without symptoms may be able to spread virus. ? This is especially important for people who are at higher risk of getting very RetroStamps.it Cover your mouth and nose with a mask when around others  You could spread COVID-19 to others even if you do not feel sick.  Everyone should wear a mask in public settings and when around people not living in their household, especially when social distancing is difficult to maintain. ? Masks should not be placed on young children under age 40, anyone who has trouble breathing, or is unconscious, incapacitated or otherwise unable to remove the mask without assistance.  The mask is meant to protect other people in case you are infected.  Do NOT use a facemask meant for a Research scientist (physical sciences).  Continue to keep about 6 feet between yourself and others. The mask is not a substitute for social distancing. Cover coughs and sneezes  Always cover your mouth and nose with a tissue when you cough or sneeze or use the inside of your elbow.  Throw used tissues in the trash.  Immediately wash your hands with soap and water for at least 20 seconds. If soap and water are not readily available, clean your hands with a hand sanitizer that contains at least 60% alcohol. Clean and disinfect  Clean AND disinfect frequently touched surfaces daily. This includes tables, doorknobs, light switches, countertops, handles, desks, phones, keyboards, toilets, faucets, and sinks. ktimeonline.com  If surfaces are dirty, clean them: Use detergent or soap and water prior to disinfection.  Then, use a household disinfectant. You can see a list of  EPA-registered household disinfectants here. [employer]Flowers.co.uk 08/24/2019 This information is not intended to replace advice given to you by your health care provider. Make sure you discuss any questions you have with your health care provider. Document Revised: 09/01/2019 Document Reviewed: 06/30/2019 Elsevier Patient Education  2020 [employer].

## 2020-09-17 NOTE — Progress Notes (Signed)
PROGRESS NOTE    Jill Garcia  YPP:[id number] DOB: Nov 01, 1966 DOA: 09/16/2020 PCP: Roanna Raider, PA   Brief Narrative:  Jill Garcia  is a 54 y.o. African-American female with a known history of anxiety/depression, fibromyalgia and migraine, as well as COVID-19 that was diagnosed 6 days ago.  Her symptoms started on Saturday with generalized body aches as well as fever and chills, dry cough with rhinorrhea and nasal and sinus congestion.  She has been having worsening dyspnea as well as fatigue.  She admitted to occasional sore throat with cough.  She was seen yesterday at [facility] urgent care and received monoclonal antibody infusion.  She has been more short of breath since yesterday.  She admitted to diarrhea without nausea or vomiting.  No chest pain or palpitations.  No leg pain or edema.  No dysuria, oliguria or hematuria or flank pain.  She has not been vaccinated against COVID-19.  Upon presentation to the emergency room, respiratory rate was 26 with otherwise normal vital signs.  She later desatted to 89% on room air with minimal ambulation.  She was then placed on 2 L O2 by nasal cannula.  Labs revealed unremarkable CMP and CBC showed mild leukopenia with relative lymphopenia and thrombocytopenia.  Urinalysis showed 6-10 WBCs with rare bacteria and negative nitrite and leukocytes.  Portable chest x-ray showed bilateral patchy perihilar and basal infiltrates consistent with multifocal Covid pneumonia. EKG showed normal sinus rhythm with rate of 64.  Her positive COVID-19 test was on 9/20.  The patient was given IV lactated Ringer bolus, IV Solu-Medrol and remdesivir.  She will be admitted to a medical monitored bed for further evaluation and management.   Assessment & Plan:   Principal Problem:   Acute hypoxemic respiratory failure due to COVID-19 (HCC) 1. Acute hypoxemic respiratory failure secondary to COVID-19 /Multifocal pneumonia secondary to COVID-19. -The patient  will be admitted to a medically monitored isolation bed. -O2 protocol will be followed to keep O2 saturation above 93. -The patient will be admitted to an isolation monitored bed with droplet and contact precautions. -Given multifocal pneumonia we will empirically place the patient on IV Rocephin and Zithromax for possible bacterial superinfection only with elevated Procalcitonin. -The patient will be placed on scheduled Mucinex and as needed Tussionex. -We will avoid nebulization as much as we can, give bronchodilator MDI if needed, and with deterioration of oxygenation try to avoid BiPAP/CPAP if possible.  -Will obtain sputum Gram stain culture and sensitivity and follow blood cultures. -O2 protocol will be followed. -We will follow CRP, ferritin, LDH and D-dimer. -Will follow manual differential for ANC/ALC ratio as well as follow troponin I and daily CBC with manual differential and CMP. - Will place the patient on IV Remdesivir and IV steroid therapy with IV Solu-Medrol with elevated inflammatory markers. -The patient will be placed on vitamin D3, vitamin C, zinc sulfate, p.o. Pepcid and aspirin. -I discussed Baricitinib and the patient agreed to proceed with it. - no change in regimen for t/t , we will do ambulating pusle oximetry and PT consult and d/c in am  Anxiety and depression. -We will continue Xanax and Cymbalta.  History of pseudotumor cerebri. -We will continue p.o. Diamox.  Fibromyalgia. -We will continue Parafon.   DVT prophylaxis: Lovenox. Code Status: Full Family Communication: None at bedside. Disposition Plan: Home  Status is: Inpatient  Remains inpatient appropriate because:Inpatient level of care appropriate due to severity of illness Dispo: The patient is from: Home  Anticipated d/c is to: Home              Anticipated d/c date is: 3 days              Patient currently is not medically stable to d/c.   Consultants:   None    Procedures: None  Antimicrobials:  Anti-infectives (From admission, onward)   Start     Dose/Rate Route Frequency Ordered Stop   09/17/20 1000  remdesivir 100 mg in sodium chloride 0.9 % 100 mL IVPB       "Followed by" Linked Group Details   100 mg 200 mL/hr over 30 Minutes Intravenous Daily 09/16/20 0154 09/21/20 0959   09/16/20 0200  remdesivir 200 mg in sodium chloride 0.9% 250 mL IVPB       "Followed by" Linked Group Details   200 mg 580 mL/hr over 30 Minutes Intravenous Once 09/16/20 0154 09/16/20 0254       Subjective: Pt is alert and oriented and denies any chest pain or sob or any complaints,except for iv hurting and wants it to be changed.   Day 2 of hospitalization d/w pt about d/c today and pt states she is little tired.   Objective: Vitals:   09/17/20 0527 09/17/20 0727 09/17/20 1127 09/17/20 1616  BP: (!) 149/81 124/76 125/79 115/70  Pulse: (!) 56 (!) 52 (!) 46 (!) 51  Resp: 16 17 17 16   Temp: 98 F (36.7 C) 98.7 F (37.1 C) 98.2 F (36.8 C) 98.1 F (36.7 C)  TempSrc: Oral Oral Oral Oral  SpO2: 95% 97% 95% 98%  Weight:      Height:        Intake/Output Summary (Last 24 hours) at 09/17/2020 1701 Last data filed at 09/17/2020 1100 Gross per 24 hour  Intake 1429.91 ml  Output --  Net 1429.91 ml   Filed Weights   09/15/20 1931  Weight: 128.8 kg   Examination: Blood pressure 115/70, pulse (!) 51, temperature 98.1 F (36.7 C), temperature source Oral, resp. rate 16, height 5\' 6"  (1.676 m), weight 128.8 kg, SpO2 98 %.  General exam: Appears calm and comfortable  Respiratory system: Clear to auscultation. Respiratory effort normal. Cardiovascular system: bradycardia RR. No JVD, murmurs, rubs, gallops or clicks. No pedal edema. Gastrointestinal system: Abdomen is nondistended, soft and nontender. No organomegaly or masses felt. Normal bowel sounds heard. Central nervous system: Alert and oriented. No focal neurological deficits. Extremities: pt moving  all ext . Skin: No rashes, lesions or ulcers Psychiatry: Mood & affect appropriate.  Data Reviewed: I have personally reviewed following labs and imaging studies  I/O last 3 completed shifts: In: 444.1 [I.V.:154.1; IV Piggyback:290] Out: -  Total I/O In: 1429.9 [I.V.:1227.9; IV Piggyback:202] Out: -  Lab Results  Component Value Date   CREATININE 0.79 09/17/2020   CREATININE 0.67 09/15/2020   CBC: Recent Labs  Lab 09/15/20 1937 09/17/20 0406  WBC 3.3* 2.3*  NEUTROABS 2.9 1.7  HGB 12.6 11.9*  HCT 39.4 35.3*  MCV 90.2 87.8  PLT 132* 151   Basic Metabolic Panel: Recent Labs  Lab 09/15/20 1937 09/17/20 0406  NA 137 140  K 3.8 3.8  CL 106 108  CO2 26 23  GLUCOSE 146* 156*  BUN 12 17  CREATININE 0.67 0.79  CALCIUM 8.3* 8.1*   GFR: Estimated Creatinine Clearance: 111.8 mL/min (by C-G formula based on SCr of 0.79 mg/dL).  Recent Results (from the past 240 hour(s))  SARS CORONAVIRUS 2 (TAT 6-24 HRS)  Nasopharyngeal Nasopharyngeal Swab     Status: Abnormal   Collection Time: 09/10/20  8:38 AM   Specimen: Nasopharyngeal Swab  Result Value Ref Range Status   SARS Coronavirus 2 POSITIVE (A) NEGATIVE Final    Comment: E MAIL M BROWING @0640  09/11/20 BY S GEZAHEGN (NOTE) SARS-CoV-2 target nucleic acids are DETECTED.  The SARS-CoV-2 RNA is generally detectable in upper and lower respiratory specimens during the acute phase of infection. Positive results are indicative of the presence of SARS-CoV-2 RNA. Clinical correlation with patient history and other diagnostic information is  necessary to determine patient infection status. Positive results do not rule out bacterial infection or co-infection with other viruses.  The expected result is Negative.  Fact Sheet for Patients: HairSlick.nohttps://www.fda.gov/media/138098/download  Fact Sheet for Healthcare Providers: quierodirigir.comhttps://www.fda.gov/media/138095/download  This test is not yet approved or cleared by the [place] FDA and   has been authorized for detection and/or diagnosis of SARS-CoV-2 by FDA under an Emergency Use Authorization (EUA). This EUA will remain  in effect (meaning this test can be used) for the duration of the COVID-19 declara tion under Section 564(b)(1) of the Act, 21 U.S.C. section 360bbb-3(b)(1), unless the authorization is terminated or revoked sooner.   Performed at [facility], [address] [place], [place] [address]       Radiology Studies: CT Angio Chest PE W and/or Wo Contrast  Result Date: 09/16/2020 CLINICAL DATA:  PE suspected. Low/intermediate probability for pulmonary embolism. Positive D-dimer. EXAM: CT ANGIOGRAPHY CHEST WITH CONTRAST TECHNIQUE: Multidetector CT imaging of the chest was performed using the standard protocol during bolus administration of intravenous contrast. Multiplanar CT image reconstructions and MIPs were obtained to evaluate the vascular anatomy. CONTRAST:  100mL OMNIPAQUE IOHEXOL 350 MG/ML SOLN COMPARISON:  None. FINDINGS: Cardiovascular: Satisfactory opacification of the pulmonary arteries to the segmental level. No evidence of pulmonary embolism. Normal heart size. No pericardial effusion. Mediastinum/Nodes: Negative for adenopathy or mass Lungs/Pleura: Patchy ground-glass opacity throughout all lobes with intervening normal lung. No air leak Upper Abdomen: Gastric bypass. Increased soft tissue density posterior to the postoperative stomach is from varices based on a April 2021 abdominal CT. Musculoskeletal: Negative Review of the MIP images confirms the above findings. IMPRESSION: 1. COVID pneumonia. 2. Negative for pulmonary embolism. Electronically Signed   By: Marnee SpringJonathon  Watts M.D.   On: 09/16/2020 07:19   DG Chest Portable 1 View  Result Date: 09/16/2020 CLINICAL DATA:  Shortness of breath and COVID positive. EXAM: PORTABLE CHEST 1 VIEW COMPARISON:  12/29/2019 FINDINGS: Shallow inspiration. Cardiac enlargement. Patchy perihilar and basilar  infiltrates consistent with multifocal COVID pneumonia. No pleural effusions. No pneumothorax. Mediastinal contours appear intact. IMPRESSION: Patchy perihilar and basilar infiltrates consistent with multifocal COVID pneumonia. Electronically Signed   By: Burman NievesWilliam  Stevens M.D.   On: 09/16/2020 02:15   Scheduled Meds: . ALPRAZolam  0.5 mg Oral QHS  . vitamin C  500 mg Oral Daily  . aspirin EC  81 mg Oral Daily  . baricitinib  4 mg Oral Daily  . cholecalciferol  1,000 Units Oral Daily  . DULoxetine  90 mg Oral Daily  . enoxaparin (LOVENOX) injection  65 mg Subcutaneous Q24H  . famotidine  20 mg Oral BID  . gabapentin  300 mg Oral BID  . guaiFENesin  600 mg Oral BID  . [START ON 09/19/2020] predniSONE  50 mg Oral Daily  . sertraline  50 mg Oral QHS  . topiramate  200 mg Oral QHS  . traZODone  50 mg Oral QHS  . cyanocobalamin  1,000 mcg Oral Daily  . zinc sulfate  220 mg Oral Daily   Continuous Infusions: . sodium chloride 100 mL/hr at 09/17/20 1100  . methylPREDNISolone (SOLU-MEDROL) injection 130 mg (09/17/20 1254)  . remdesivir 100 mg in NS 100 mL Stopped (09/17/20 1031)     LOS: 1 day    Gertha Calkin, MD [facility] Pager [phone] If 7PM-7AM, please contact night-coverage [email or web address] Password [facility] 09/17/2020, 5:01 PM

## 2020-09-17 NOTE — Progress Notes (Signed)
SATURATION QUALIFICATIONS: (This note is used to comply with regulatory documentation for home oxygen)  Patient Saturations on Room Air at Rest = 92%  Patient Saturations on Room Air while Ambulating = 86%  Patient Saturations on 2 Liters of oxygen while Ambulating = 96%  Please briefly explain why patient needs home oxygen:  Patient able to ambulate without assistance using O2 at 2 Liters via [place] without complaints of SOB, dizziness or weakness.  Ambulating on RA patient did complain of SOB

## 2020-09-18 LAB — CBC WITH DIFFERENTIAL/PLATELET
Abs Immature Granulocytes: 0.02 10*3/uL (ref 0.00–0.07)
Basophils Absolute: 0 10*3/uL (ref 0.0–0.1)
Basophils Relative: 0 %
Eosinophils Absolute: 0 10*3/uL (ref 0.0–0.5)
Eosinophils Relative: 0 %
HCT: 34 % — ABNORMAL LOW (ref 36.0–46.0)
Hemoglobin: 11.2 g/dL — ABNORMAL LOW (ref 12.0–15.0)
Immature Granulocytes: 1 %
Lymphocytes Relative: 14 %
Lymphs Abs: 0.3 10*3/uL — ABNORMAL LOW (ref 0.7–4.0)
MCH: 29.1 pg (ref 26.0–34.0)
MCHC: 32.9 g/dL (ref 30.0–36.0)
MCV: 88.3 fL (ref 80.0–100.0)
Monocytes Absolute: 0.1 10*3/uL (ref 0.1–1.0)
Monocytes Relative: 5 %
Neutro Abs: 2 10*3/uL (ref 1.7–7.7)
Neutrophils Relative %: 80 %
Platelets: 184 10*3/uL (ref 150–400)
RBC: 3.85 MIL/uL — ABNORMAL LOW (ref 3.87–5.11)
RDW: 12.8 % (ref 11.5–15.5)
WBC: 2.4 10*3/uL — ABNORMAL LOW (ref 4.0–10.5)
nRBC: 0 % (ref 0.0–0.2)

## 2020-09-18 LAB — COMPREHENSIVE METABOLIC PANEL
ALT: 17 U/L (ref 0–44)
AST: 21 U/L (ref 15–41)
Albumin: 2.9 g/dL — ABNORMAL LOW (ref 3.5–5.0)
Alkaline Phosphatase: 64 U/L (ref 38–126)
Anion gap: 8 (ref 5–15)
BUN: 19 mg/dL (ref 6–20)
CO2: 22 mmol/L (ref 22–32)
Calcium: 8 mg/dL — ABNORMAL LOW (ref 8.9–10.3)
Chloride: 110 mmol/L (ref 98–111)
Creatinine, Ser: 0.84 mg/dL (ref 0.44–1.00)
GFR calc Af Amer: >60 mL/min (ref >60)
GFR calc non Af Amer: >60 mL/min (ref >60)
Glucose, Bld: 157 mg/dL — ABNORMAL HIGH (ref 70–99)
Potassium: 3.7 mmol/L (ref 3.5–5.1)
Sodium: 140 mmol/L (ref 135–145)
Total Bilirubin: 0.6 mg/dL (ref 0.3–1.2)
Total Protein: 5.8 g/dL — ABNORMAL LOW (ref 6.5–8.1)

## 2020-09-18 LAB — FERRITIN: Ferritin: 130 ng/mL (ref 11–307)

## 2020-09-18 LAB — LACTATE DEHYDROGENASE: LDH: 248 U/L — ABNORMAL HIGH (ref 98–192)

## 2020-09-18 LAB — C-REACTIVE PROTEIN: CRP: 1 mg/dL — ABNORMAL HIGH (ref <1.0)

## 2020-09-18 LAB — FIBRIN DERIVATIVES D-DIMER (ARMC ONLY): Fibrin derivatives D-dimer (ARMC): 406.69 ng/mL (FEU) (ref 0.00–499.00)

## 2020-09-18 NOTE — Evaluation (Signed)
Physical Therapy Evaluation Patient Details Name: Jill Garcia MRN: [id number] DOB: [birth year] Today's Date: 09/18/2020   History of Present Illness  Pt is a 54 y.o. female with a history of obesity, fibromyalgia, migraines, anxiety and depression who presents for evaluation of shortness of breath.  Patient was diagnosed with Covid 6 days ago. Pt was recently seen at [facility] urgent care and received monoclonal antibody fusion. Imaging significant for multifocal COVID PNA. CBC showed mild leukopenia with relative lymphopenia and thrombocytopenia.    Clinical Impression  Pt is a 54 year old F admitted to hospital on 09/16/20 for acute hypoxemic respiratory failure secondary to COVID. At baseline, pt is independent with all ADL's, IADL's, and community ambulation without AD. Pt presents with increased O2 dependence from baseline, decreased activity tolerance, fair standing balance without AD, bradycardia, and lightheadedness/dizziness resulting in impaired functional mobility from baseline. Due to deficits, pt required mod I for bed mobility and supervision for safety and line management with transfers and short distance ambulation. Pt with RPE of 2-3/10 after mobility indicating "light activity", but demonstrated increased RR and had c/o fatigue. Pt SpO2 remained >90% throughout session on 2L O2 via hi-flow [place] with BP WNL. Pt bradycardic throughout session with HR ranging from 38-81 bpm; RN notified. Pt with intermittent bouts of lethargy where she was unable to speak, during session with increased lightheadedness. Deficits limit the pt's ability to safely and independently perform ADL's, transfer, and ambulate. Pt will benefit from acute skilled PT services to address deficits for return to baseline function. At this time, PT recommends DC home with HHPT and intermittent supervision. Pt will benefit from RW, BSC, and shower chair for energy conservation and to improve overall safety with functional  mobility at DC. Pt with concerns about transportation at DC and to follow up appointments in [place], if dizziness/lightheadedness persists; RN and CM notified.     Follow Up Recommendations Home health PT;Supervision - Intermittent    Equipment Recommendations  Rolling walker with 5" wheels;3in1 (PT);Other (comment) (shower chair)    Recommendations for Other Services       Precautions / Restrictions Precautions Precautions: Fall Restrictions Weight Bearing Restrictions: No      Mobility  Bed Mobility Overal bed mobility: Modified Independent                Transfers Overall transfer level: Needs assistance Equipment used: None Transfers: Sit to/from Stand Sit to Stand: Supervision         General transfer comment: Supervision for safety and line management due to pt c/o dizziness and bradycardia. Pt demonstrates good safety awareness with utilization of BUE for support and controlled descent.  Ambulation/Gait Ambulation/Gait assistance: Supervision Gait Distance (Feet): 14 Feet (110ft x2) Assistive device: None       General Gait Details: Pt required supervision for safety and line management to ambulate short distances around end of bed and back without AD, requiring x1 seated rest break. Pt demonstrated slowed cadence, intermittent reaching outside BOS for furniture to steady balance, and decreased step length/foot clearance bil. Pt with RPE of 2-3/10 indicating "light exercise". Further mobility limited secondary to pt persistent complaints of lightheadedness/dizziness; vitals WNL.  Stairs            Wheelchair Mobility    Modified Rankin (Stroke Patients Only)       Balance Overall balance assessment: Modified Independent Sitting-balance support: No upper extremity supported;Feet supported Sitting balance-Leahy Scale: Good Sitting balance - Comments: Pt with good seated balance at  EOB. Pt able to don socks with significant anterior weight  shift and no LOB.   Standing balance support: During functional activity;Single extremity supported Standing balance-Leahy Scale: Fair Standing balance comment: Pt with fair standing balance during short distance ambulation. Pt did not use AD, but would intermittently reach for furniture to steady balance.                             Pertinent Vitals/Pain Pain Assessment: No/denies pain    Home Living Family/patient expects to be discharged to:: Private residence Living Arrangements: Children Available Help at Discharge: Other (Comment) (Pt reports she has none; lives with 10yo daughter and other daughter is pregnant and due 10/11/20) Type of Home: Apartment Home Access: Level entry     Home Layout: One level Home Equipment: None      Prior Function Level of Independence: Independent         Comments: Pt reports being independent with all ADL's, IADL's, and community ambulation without AD.     Hand Dominance        Extremity/Trunk Assessment   Upper Extremity Assessment Upper Extremity Assessment: Overall WFL for tasks assessed    Lower Extremity Assessment Lower Extremity Assessment: Overall WFL for tasks assessed    Cervical / Trunk Assessment Cervical / Trunk Assessment: Normal  Communication   Communication: No difficulties  Cognition Arousal/Alertness: Awake/alert Behavior During Therapy: WFL for tasks assessed/performed Overall Cognitive Status: Within Functional Limits for tasks assessed                                 General Comments: Cognition grossly WFL during session. Slowed processing noted with fatigue and increased c/o dizziness/lightheadedness.      General Comments      Exercises     Assessment/Plan    PT Assessment Patient needs continued PT services  PT Problem List Decreased mobility;Obesity;Decreased activity tolerance;Cardiopulmonary status limiting activity;Decreased balance       PT Treatment  Interventions Gait training;Functional mobility training;Therapeutic activities;Therapeutic exercise;Balance training;Neuromuscular re-education    PT Goals (Current goals can be found in the Care Plan section)  Acute Rehab PT Goals Patient Stated Goal: "to go home" PT Goal Formulation: With patient Time For Goal Achievement: 10/02/20 Potential to Achieve Goals: Good    Frequency Min 2X/week   Barriers to discharge Decreased caregiver support      Co-evaluation               AM-PAC PT "6 Clicks" Mobility  Outcome Measure Help needed turning from your back to your side while in a flat bed without using bedrails?: None Help needed moving from lying on your back to sitting on the side of a flat bed without using bedrails?: None Help needed moving to and from a bed to a chair (including a wheelchair)?: A Little Help needed standing up from a chair using your arms (e.g., wheelchair or bedside chair)?: A Little Help needed to walk in hospital room?: A Little Help needed climbing 3-5 steps with a railing? : A Little 6 Click Score: 20    End of Session Equipment Utilized During Treatment: Gait belt Activity Tolerance: Patient tolerated treatment well;Patient limited by fatigue Patient left: in bed;with call bell/phone within reach;with bed alarm set Nurse Communication: Mobility status (DC rec, vitals, pt concerns regarding transportation) PT Visit Diagnosis: Unsteadiness on feet (R26.81);Other abnormalities of gait and  mobility (R26.89)    Time: 6712-4580 PT Time Calculation (min) (ACUTE ONLY): 31 min   Charges:   PT Evaluation $PT Eval Low Complexity: 1 Low PT Treatments $Therapeutic Activity: 8-22 mins       Vira Blanco, PT, DPT 12:23 PM,09/18/20

## 2020-09-18 NOTE — Progress Notes (Signed)
PROGRESS NOTE    Jill Garcia  SJG:[id number] DOB: 04/05/1966 DOA: 09/16/2020  PCP: Roanna Raider, PA    Brief Narrative:  Jill Garcia y.o.African-American femalewith a known history of anxiety/depression, fibromyalgia and migraine, as well as COVID-19 that was diagnosed 6 days ago. Her symptoms started on Saturday with generalized body aches as well as fever and chills, dry cough with rhinorrhea and nasal and sinus congestion. She has been having worsening dyspnea as well as fatigue. She admitted to occasional sore throat with cough. She was seen yesterday at [facility] urgent care and received monoclonal antibody infusion. She has been more short of breath since yesterday. She admitted to diarrhea without nausea or vomiting. No chest pain or palpitations. No leg pain or edema. No dysuria, oliguria or hematuria or flank pain. She has not been vaccinated against COVID-19.  Upon presentation to the emergency room, respiratory rate was 26 with otherwise normal vital signs. She later desatted to 89% on room air with minimal ambulation. She was then placed on 2 L O2 by nasal cannula. Labs revealed unremarkable CMP and CBC showed mild leukopenia with relative lymphopenia and thrombocytopenia. Urinalysis showed 6-10 WBCs with rare bacteria and negative nitrite and leukocytes. Portable chest x-ray showed bilateral patchy perihilar and basal infiltrates consistent with multifocal Covid pneumonia. EKG showed normal sinus rhythm with rate of 64. Her positive COVID-19 test was on 9/20.  The patient was given IV lactated Ringer bolus, IV Solu-Medrol and remdesivir. She will be admitted to a medical monitored bed for further evaluation and management.  Assessment & Plan: Principal Problem:   Acute hypoxemic respiratory failure due to COVID-19 (HCC) 1. Acute hypoxemic respiratory failure secondary to COVID-19 /Multifocal pneumonia secondary to COVID-19. -The patient  will be admitted to a medically monitored isolation bed. -O2 protocol will be followed to keep O2 saturation above 93. -The patient will be admitted to an isolation monitored bed with droplet and contact precautions. -Given multifocal pneumonia we will empirically place the patient on IV Rocephin and Zithromax for possible bacterial superinfection only with elevated Procalcitonin. -The patient will be placed on scheduled Mucinex and as needed Tussionex. -We will avoid nebulization as much as we can, give bronchodilator MDI if needed, and with deterioration of oxygenation try to avoid BiPAP/CPAP if possible.  -Will obtain sputum Gram stain culture and sensitivity and follow blood cultures. -O2 protocol will be followed. -We will follow CRP, ferritin, LDH and D-dimer. -Will follow manual differential for ANC/ALC ratio as well as follow troponin I and daily CBC with manual differential and CMP. -Will place the patient on IV Remdesivir and IV steroid therapy with IV Solu-Medrol with elevated inflammatory markers. -The patient will be placed on vitamin D3, vitamin C, zinc sulfate, p.o. Pepcid and aspirin. -I discussed Baricitinib and the patient agreed to proceed with it. - no change in regimen for t/t , we will do ambulating pusle oximetry and PT consult and d/c in am -pt is on day 3 of remdesivir and has two more doses left and anticipate she can go home tomorrow or day after once PT works with pt.  Anxiety and depression. -We will continue Xanax and Cymbalta.  History of pseudotumor cerebri. -We will continue p.o. Diamox.  Fibromyalgia. -We will continue Parafon.  DVT prophylaxis:  Code Status:  Family Communication:  Disposition Plan:   Status is: Inpatient  Remains inpatient appropriate because:Inpatient level of care appropriate due to severity of illness   Dispo: The patient is from: Home  Anticipated d/c is to: Home              Anticipated d/c date is: 2  days              Patient currently is not medically stable to d/c.  Consultants:  PT. CM.   Procedures:  None  Subjective: Pt is alert and oriented and denies any chest pain or sob or any complaints,except for iv hurting and wants it to be changed.   Day 2 of hospitalization d/w pt about d/c today and pt states she is little tired.   Pt seen today and is saying her throat pain is much improved today and she slept last night. D/w her about d/c pt gets dizzy with standing and o2 need is stable .    Objective: Vitals:   09/17/20 1934 09/18/20 0100 09/18/20 0411 09/18/20 0728  BP: (!) 133/94 129/90 125/74 125/75  Pulse: 72  (!) 51 (!) 51  Resp: (!) 25  20 15   Temp: 98 F (36.7 C) 98 F (36.7 C) 98.6 F (37 C) 98 F (36.7 C)  TempSrc:  Oral  Oral  SpO2: 98%  98% 98%  Weight:      Height:       SpO2: 98 % O2 Flow Rate (L/min): 2 L/min  Intake/Output Summary (Last 24 hours) at 09/18/2020 1230 Last data filed at 09/18/2020 0900 Gross per 24 hour  Intake 720 ml  Output 1 ml  Net 719 ml   Filed Weights   09/15/20 1931  Weight: 128.8 kg    Examination: Blood pressure 125/75, pulse (!) 51, temperature 98 F (36.7 C), temperature source Oral, resp. rate 15, height 5\' 6"  (1.676 m), weight 128.8 kg, SpO2 98 %. General exam: Appears calm and comfortable  HEENT:EOMI, perrl  Respiratory system: Clear to auscultation. Respiratory effort normal. Cardiovascular system: S1 & S2 heard, RRR. No JVD, murmurs, rubs, gallops or clicks. No pedal edema. Gastrointestinal system: Abdomen is nondistended, soft and nontender. No organomegaly or masses felt. Normal bowel sounds heard. Central nervous system: Alert and oriented.CN grossly intact No focal neurological deficits. Extremities: pt moving all 4 ext and ambulating with assistance.  Skin: No rashes, lesions or ulcers Psychiatry: Judgement and insight appear normal. Mood & affect appropriate.     Data Reviewed: I have  personally reviewed following labs and imaging studies  I/O last 3 completed shifts: In: 1909.9 [P.O.:480; I.V.:1227.9; IV Piggyback:202] Out: -  Total I/O In: 240 [P.O.:240] Out: 1 [Urine:1] Lab Results  Component Value Date   CREATININE 0.84 09/18/2020   CREATININE 0.79 09/17/2020   CREATININE 0.67 09/15/2020   CBC: Recent Labs  Lab 09/15/20 1937 09/17/20 0406 09/18/20 0547  WBC 3.3* 2.3* 2.4*  NEUTROABS 2.9 1.7 2.0  HGB 12.6 11.9* 11.2*  HCT 39.4 35.3* 34.0*  MCV 90.2 87.8 88.3  PLT 132* 151 184   Basic Metabolic Panel: Recent Labs  Lab 09/15/20 1937 09/17/20 0406 09/18/20 0547  NA 137 140 140  K 3.8 3.8 3.7  CL 106 108 110  CO2 26 23 22   GLUCOSE 146* 156* 157*  BUN 12 17 19   CREATININE 0.67 0.79 0.84  CALCIUM 8.3* 8.1* 8.0*   GFR: Estimated Creatinine Clearance: 106.5 mL/min (by C-G formula based on SCr of 0.84 mg/dL). Liver Function Tests: Recent Labs  Lab 09/17/20 0406 09/18/20 0547  AST 24 21  ALT 17 17  ALKPHOS 66 64  BILITOT 0.5 0.6  PROT 6.1* 5.8*  ALBUMIN 2.9*  2.9*   No results for input(s): LIPASE, AMYLASE in the last 168 hours. No results for input(s): AMMONIA in the last 168 hours.  Coagulation Profile: No results for input(s): INR, PROTIME in the last 168 hours. Cardiac Enzymes: Recent Labs  Lab 09/17/20 0406  CKTOTAL 88   BNP (last 3 results) No results for input(s): PROBNP in the last 8760 hours. HbA1C: Recent Labs    09/16/20 0807  HGBA1C 6.0*   CBG: No results for input(s): GLUCAP in the last 168 hours. Lipid Profile: No results for input(s): CHOL, HDL, LDLCALC, TRIG, CHOLHDL, LDLDIRECT in the last 72 hours. Thyroid Function Tests: No results for input(s): TSH, T4TOTAL, FREET4, T3FREE, THYROIDAB in the last 72 hours. Anemia Panel: Recent Labs    09/17/20 0406 09/18/20 0547  FERRITIN 147 130   Sepsis Labs: No results for input(s): PROCALCITON, LATICACIDVEN in the last 168 hours.  Recent Results (from the past  240 hour(s))  SARS CORONAVIRUS 2 (TAT 6-24 HRS) Nasopharyngeal Nasopharyngeal Swab     Status: Abnormal   Collection Time: 09/10/20  8:38 AM   Specimen: Nasopharyngeal Swab  Result Value Ref Range Status   SARS Coronavirus 2 POSITIVE (A) NEGATIVE Final    Comment: E MAIL M BROWING @0640  09/11/20 BY S GEZAHEGN (NOTE) SARS-CoV-2 target nucleic acids are DETECTED.  The SARS-CoV-2 RNA is generally detectable in upper and lower respiratory specimens during the acute phase of infection. Positive results are indicative of the presence of SARS-CoV-2 RNA. Clinical correlation with patient history and other diagnostic information is  necessary to determine patient infection status. Positive results do not rule out bacterial infection or co-infection with other viruses.  The expected result is Negative.  Fact Sheet for Patients: 09/13/20  Fact Sheet for Healthcare Providers: HairSlick.no  This test is not yet approved or cleared by the quierodirigir.com FDA and  has been authorized for detection and/or diagnosis of SARS-CoV-2 by FDA under an Emergency Use Authorization (EUA). This EUA will remain  in effect (meaning this test can be used) for the duration of the COVID-19 declara tion under Section 564(b)(1) of the Act, 21 U.S.C. section 360bbb-3(b)(1), unless the authorization is terminated or revoked sooner.   Performed at [facility], [address], [place], [place]       Radiology Studies: No results found.     Anti-infectives (From admission, onward)   Start     Dose/Rate Route Frequency Ordered Stop   09/17/20 1000  remdesivir 100 mg in sodium chloride 0.9 % 100 mL IVPB       "Followed by" Linked Group Details   100 mg 200 mL/hr over 30 Minutes Intravenous Daily 09/16/20 0154 09/21/20 0959   09/16/20 0200  remdesivir 200 mg in sodium chloride 0.9% 250 mL IVPB       "Followed by" Linked Group  Details   200 mg 580 mL/hr over 30 Minutes Intravenous Once 09/16/20 0154 09/16/20 0254      Scheduled Meds: . ALPRAZolam  0.5 mg Oral QHS  . vitamin C  500 mg Oral Daily  . aspirin EC  81 mg Oral Daily  . baricitinib  4 mg Oral Daily  . cholecalciferol  1,000 Units Oral Daily  . DULoxetine  90 mg Oral Daily  . enoxaparin (LOVENOX) injection  65 mg Subcutaneous Q24H  . famotidine  20 mg Oral BID  . gabapentin  300 mg Oral BID  . guaiFENesin  600 mg Oral BID  . [START ON  09/19/2020] predniSONE  50 mg Oral Daily  . sertraline  50 mg Oral QHS  . topiramate  200 mg Oral QHS  . traZODone  50 mg Oral QHS  . cyanocobalamin  1,000 mcg Oral Daily  . zinc sulfate  220 mg Oral Daily   Continuous Infusions: . sodium chloride 100 mL/hr at 09/17/20 1100  . methylPREDNISolone (SOLU-MEDROL) injection 130 mg (09/18/20 0323)  . remdesivir 100 mg in NS 100 mL 100 mg (09/18/20 0831)     LOS: 2 days    Gertha CalkinEkta V Bayan Kushnir, MD [facility] Pager [phone] If 7PM-7AM, please contact night-coverage [email or web address] Password [email or web address] 09/18/2020, 12:30 PM

## 2020-09-18 NOTE — TOC Initial Note (Signed)
Transition of Care [facility]) - Initial/Assessment Note    Patient Details  Name: Jill Garcia MRN: [id number] Date of Birth: 01/21/1966  Transition of Care [facility]) CM/SW Contact:    Allayne Butcher, RN Phone Number: 09/18/2020, 1:23 PM  Clinical Narrative:                 Patient admitted to the hospital with COVID requiring 2L [place] supplemental oxygen.  RNCM was able to speak with patient via phone.  Patient is from home with her 26 year old daughter, patient's older 61 year old daughter is watching the 28 year old while patient is in the hospital.  Patient is independent at home and requires no assistive devices and drives.  Patient is current with her PCP and gets her prescriptions from [facility] in [place].  Patient agrees to home health services and has no agency preference.  [facility] accepted referral and can go out and see patient on Friday.  Planned discharge for Thursday after last dose of Remdesivir.  Patient will also need a walker, 3 in 1 and shower stool at discharge which will be provided by [facility].  TOC team to continue to follow.   Expected Discharge Plan: Home w Home Health Services Barriers to Discharge: Continued Medical Work up   Patient Goals and CMS Choice Patient states their goals for this hospitalization and ongoing recovery are:: Patient wants to get to feeling back to normal CMS Medicare.gov Compare Post Acute Care list provided to:: Patient Choice offered to / list presented to : Patient  Expected Discharge Plan and Services Expected Discharge Plan: Home w Home Health Services   Discharge Planning Services: CM Consult Post Acute Care Choice: Home Health Living arrangements for the past 2 months: Single Family Home                 DME Arranged: 3-N-1, Shower stool, Walker rolling DME Agency: [facility] Date DME Agency Contacted: 09/18/20 Time DME Agency Contacted: 1311 Representative spoke with at DME Agency: Oletha Cruel HH Arranged: RN, PT, OT HH Agency: [facility] Date [facility] Agency Contacted: 09/18/20 Time HH Agency Contacted: 1312 Representative spoke with at [facility] Agency: Christy Gentles  Prior Living Arrangements/Services Living arrangements for the past 2 months: Single Family Home Lives with:: Minor Children Patient language and need for interpreter reviewed:: Yes Do you feel safe going back to the place where you live?: Yes      Need for Family Participation in Patient Care: Yes (Comment) (COVID, weakness) Care giver support system in place?: Yes (comment) (adult daughter)   Criminal Activity/Legal Involvement Pertinent to Current Situation/Hospitalization: No - Comment as needed  Activities of Daily Living Home Assistive Devices/Equipment: None ADL Screening (condition at time of admission) Patient's cognitive ability adequate to safely complete daily activities?: Yes Is the patient deaf or have difficulty hearing?: No Does the patient have difficulty seeing, even when wearing glasses/contacts?: No Does the patient have difficulty concentrating, remembering, or making decisions?: No Patient able to express need for assistance with ADLs?: Yes Does the patient have difficulty dressing or bathing?: No Independently performs ADLs?: Yes (appropriate for developmental age) Does the patient have difficulty walking or climbing stairs?: No Weakness of Legs: None Weakness of Arms/Hands: None  Permission Sought/Granted Permission sought to share information with : Case Manager, Other (comment) Permission granted to share information with : Yes, Verbal Permission Granted     Permission granted to share info w AGENCY: [facility]  Emotional Assessment   Attitude/Demeanor/Rapport: Engaged Affect (typically observed): Accepting Orientation: : Oriented to Self, Oriented to Place, Oriented to  Time, Oriented to Situation Alcohol / Substance Use: Not Applicable Psych Involvement: No (comment)  Admission diagnosis:  Acute  respiratory failure with hypoxia (HCC) [J96.01] Acute hypoxemic respiratory failure due to COVID-19 (HCC) [U07.1, J96.01] COVID-19 [U07.1] Patient Active Problem List   Diagnosis Date Noted  . Acute hypoxemic respiratory failure due to COVID-19 [facility]) 09/16/2020   PCP:  Roanna Raider, PA Pharmacy:   [facility] [phone] [facility] [place] - [address] AT [facility] OF [address] [facility] [place] [address] Phone: [phone] Fax: [phone]     Social Determinants of Health (SDOH) Interventions    Readmission Risk Interventions No flowsheet data found.

## 2020-09-19 LAB — FIBRIN DERIVATIVES D-DIMER (ARMC ONLY): Fibrin derivatives D-dimer (ARMC): 501.47 ng/mL (FEU) — ABNORMAL HIGH (ref 0.00–499.00)

## 2020-09-19 LAB — COMPREHENSIVE METABOLIC PANEL
ALT: 25 U/L (ref 0–44)
AST: 26 U/L (ref 15–41)
Albumin: 2.8 g/dL — ABNORMAL LOW (ref 3.5–5.0)
Alkaline Phosphatase: 59 U/L (ref 38–126)
Anion gap: 7 (ref 5–15)
BUN: 19 mg/dL (ref 6–20)
CO2: 24 mmol/L (ref 22–32)
Calcium: 8 mg/dL — ABNORMAL LOW (ref 8.9–10.3)
Chloride: 108 mmol/L (ref 98–111)
Creatinine, Ser: 0.76 mg/dL (ref 0.44–1.00)
GFR calc Af Amer: >60 mL/min (ref >60)
GFR calc non Af Amer: >60 mL/min (ref >60)
Glucose, Bld: 143 mg/dL — ABNORMAL HIGH (ref 70–99)
Potassium: 3.8 mmol/L (ref 3.5–5.1)
Sodium: 139 mmol/L (ref 135–145)
Total Bilirubin: 0.5 mg/dL (ref 0.3–1.2)
Total Protein: 5.7 g/dL — ABNORMAL LOW (ref 6.5–8.1)

## 2020-09-19 LAB — CBC WITH DIFFERENTIAL/PLATELET
Abs Immature Granulocytes: 0.03 10*3/uL (ref 0.00–0.07)
Basophils Absolute: 0 10*3/uL (ref 0.0–0.1)
Basophils Relative: 0 %
Eosinophils Absolute: 0 10*3/uL (ref 0.0–0.5)
Eosinophils Relative: 0 %
HCT: 33.5 % — ABNORMAL LOW (ref 36.0–46.0)
Hemoglobin: 10.7 g/dL — ABNORMAL LOW (ref 12.0–15.0)
Immature Granulocytes: 1 %
Lymphocytes Relative: 12 %
Lymphs Abs: 0.3 10*3/uL — ABNORMAL LOW (ref 0.7–4.0)
MCH: 28.8 pg (ref 26.0–34.0)
MCHC: 31.9 g/dL (ref 30.0–36.0)
MCV: 90.3 fL (ref 80.0–100.0)
Monocytes Absolute: 0.2 10*3/uL (ref 0.1–1.0)
Monocytes Relative: 7 %
Neutro Abs: 1.8 10*3/uL (ref 1.7–7.7)
Neutrophils Relative %: 80 %
Platelets: 201 10*3/uL (ref 150–400)
RBC: 3.71 MIL/uL — ABNORMAL LOW (ref 3.87–5.11)
RDW: 12.9 % (ref 11.5–15.5)
WBC: 2.3 10*3/uL — ABNORMAL LOW (ref 4.0–10.5)
nRBC: 0 % (ref 0.0–0.2)

## 2020-09-19 LAB — LACTATE DEHYDROGENASE: LDH: 230 U/L — ABNORMAL HIGH (ref 98–192)

## 2020-09-19 LAB — C-REACTIVE PROTEIN: CRP: 0.7 mg/dL (ref <1.0)

## 2020-09-19 LAB — FERRITIN: Ferritin: 108 ng/mL (ref 11–307)

## 2020-09-19 NOTE — Care Management Important Message (Signed)
Important Message  Patient Details  Name: Jill Garcia MRN: [id number] Date of Birth: [birth year]   Medicare Important Message Given:  Yes  Reviewed with patient via room phone due to isolation status.  Copy of Medicare IM sent securely to email address provided once verifying accuracy: [email or web address].   Johnell Comings 09/19/2020, 11:23 AM

## 2020-09-19 NOTE — Progress Notes (Signed)
Physical Therapy Treatment Patient Details Name: Jill Garcia MRN: [id number] DOB: [birth year] Today's Date: 09/19/2020    History of Present Illness Pt is a 54 y.o. female with a history of obesity, fibromyalgia, migraines, anxiety and depression who presents for evaluation of shortness of breath.  Patient was diagnosed with Covid 6 days ago. Pt was recently seen at [facility] urgent care and received monoclonal antibody fusion. Imaging significant for multifocal COVID PNA. CBC showed mild leukopenia with relative lymphopenia and thrombocytopenia.    PT Comments    Pt tolerated treatment well today. Pt with improved dizziness/lightheadedness, bradycardia, and O2 dependence. Pt on RA upon PT entry with SpO2 at 97%. Pt able to ambulate full lap around unit (~114ft) on RA, while holding a full conversation and SpO2 >91%; HR in 70-80's. Pt demonstrates improved recovery time as she was able to go from 91%-94% within 30s with standing rest break. Pt grossly requires supervision for line management, but is able to be independent with mobility. Pt with mild c/o chest pain post session; RN notified. Pt will continue to benefit from skilled acute PT services to improve overall activity tolerance for improved safety upon DC. Will continue to recommend DC home with HHPT and intermittent supervision.    Follow Up Recommendations  Home health PT;Supervision - Intermittent     Equipment Recommendations  Rolling walker with 5" wheels;3in1 (PT);Other (comment)    Recommendations for Other Services       Precautions / Restrictions Precautions Precautions: Fall Restrictions Weight Bearing Restrictions: No    Mobility  Bed Mobility                  Transfers Overall transfer level: Independent Equipment used: None Transfers: Sit to/from Stand Sit to Stand: Supervision;Independent         General transfer comment: Supervision for safety and line management to perform STS from recliner  x2 without AD. Pt able to be Ind with mobility; per RN, pt has been independently ambulating in room.  Ambulation/Gait Ambulation/Gait assistance: Supervision Gait Distance (Feet): 160 Feet Assistive device: None     Gait velocity interpretation: <1.31 ft/sec, indicative of household ambulator General Gait Details: Pt required supervision for line management during ambulation; otherwise pt able to be mod I. Pt demonstrated slowed cadence and reciprocal gait. Pt able to hold full conversation during ambulation with SpO2 >91% on RA; pt without c/o DOE/SOB. Pt took x1 standing rest break for vitals; verbal cues provided for pursed lip breathing and pt able to improve SpO2 to 94% within 30s.   Stairs             Wheelchair Mobility    Modified Rankin (Stroke Patients Only)       Balance Overall balance assessment: Modified Independent Sitting-balance support: No upper extremity supported;Feet supported Sitting balance-Leahy Scale: Good Sitting balance - Comments: Pt with good seated balance at edge of recliner.   Standing balance support: During functional activity Standing balance-Leahy Scale: Good Standing balance comment: Pt with good standing balance without AD. Able to pull and close room door without LOB.                            Cognition Arousal/Alertness: Awake/alert Behavior During Therapy: WFL for tasks assessed/performed Overall Cognitive Status: Within Functional Limits for tasks assessed  General Comments: More alert today without c/o dizziness/lightheadedness.      Exercises Other Exercises Other Exercises: Pt able to ambulate full lap around unit (~1110ft) with x1 standing rest break for vitals. Pt able to hold full conversation with SpO2 >91% on RA; verbal cues for pursed lip breathing provided during standing rest break. Pt able to improve SpO2 from 91%-94% within 30s. Other Exercises: Pt educated  regarding the following: pulse ox reading and significance, pursed lip breathing to improve SpO2, signs and symptoms related to decreased SpO2, activity modification at home; she verbalized understanding.    General Comments        Pertinent Vitals/Pain Pain Assessment: No/denies pain    Home Living                      Prior Function            PT Goals (current goals can now be found in the care plan section) Progress towards PT goals: Progressing toward goals    Frequency    Min 2X/week      PT Plan Current plan remains appropriate    Co-evaluation              AM-PAC PT "6 Clicks" Mobility   Outcome Measure  Help needed turning from your back to your side while in a flat bed without using bedrails?: None Help needed moving from lying on your back to sitting on the side of a flat bed without using bedrails?: None Help needed moving to and from a bed to a chair (including a wheelchair)?: A Little Help needed standing up from a chair using your arms (e.g., wheelchair or bedside chair)?: None Help needed to walk in hospital room?: A Little Help needed climbing 3-5 steps with a railing? : A Little 6 Click Score: 21    End of Session Equipment Utilized During Treatment: Gait belt Activity Tolerance: Patient tolerated treatment well Patient left: with call bell/phone within reach;in chair Nurse Communication: Mobility status (vitals) PT Visit Diagnosis: Unsteadiness on feet (R26.81);Other abnormalities of gait and mobility (R26.89)     Time: 1452-1510 PT Time Calculation (min) (ACUTE ONLY): 18 min  Charges:  $Therapeutic Exercise: 8-22 mins                     Vira Blanco, PT, DPT 3:38 PM,09/19/20

## 2020-09-19 NOTE — Progress Notes (Signed)
PROGRESS NOTE    Jill Garcia  LGX:[id number] DOB: 1966-01-23 DOA: 09/16/2020 PCP: Roanna Raider, PA    Brief Narrative:  Jill Garcia is a 54 year old female with past medical history notable for anxiety/depression, fibromyalgia, migraine headache who presented to the ED with progressive myalgias, shortness of breath, and cough.  Patient was recently diagnosed with Covid-19 virus, and received a monoclonal antibody infusion.  In the ED, patient was noted to have a elevated respiratory rate of 26, with hypoxia with SPO2 89% on room air at rest.  Chest x-ray with findings consistent with multifocal pneumonia. Patient was given IV LR bolus, IV Solu-Medrol and remdesivir.  Duration consulted for admission.   Assessment & Plan:   Principal Problem:   Acute hypoxemic respiratory failure due to COVID-19 [facility])   Acute hypoxic respiratory failure secondary to acute Covid-19 viral pneumonia during the ongoing 2020 Covid 19 Pandemic - POA Patient presenting with progressive shortness of breath, found to be hypoxic with SPO2 89% on room air.  Chest x-ray consistent with multifocal pneumonia. --COVID test: + 09/10/2020 --CRP 2.8>1.0>0.7 --ddimer 1,105>994>664>406>501 --Remdesivir, plan 5-day course (Day #4/5) --Baricitinib 4mg  PO daily (Day #4/14) --Prednisone 50mg  PO daily --prone for 2-3hrs every 12hrs if able --Continue supplemental oxygen, titrate to maintain SPO2 greater than 92%, on 2L [place] with SPO2 98% --Continue supportive care with albuterol MDI prn, vitamin C, zinc, Tylenol, antitussives (benzonatate/ Mucinex/Tussionex) --Follow CBC, CMP, D-dimer, ferritin, and CRP daily --Continue airborne/contact isolation precautions for 3 weeks from the day of diagnosis  The treatment plan and use of medications and known side effects were discussed with patient/family. Some of the medications used are based on case reports/anecdotal data.  All other medications being used in the management  of COVID-19 based on limited study data.  Complete risks and long-term side effects are unknown, however in the best clinical judgment they seem to be of some benefit.  Patient wanted to proceed with treatment options provided.  Anxiety/depression: --Alprazolam 0.5 mg nightly --Duloxetine 90 mg daily --Zoloft 50 mg p.o. daily  Fibromyalgia --chlorzoxazone 500mg  q6h prn muscle spasms  Insomnia: Trazodone 50 mg nightly  Hx migraine headache: Continue Topamax 200 mg p.o. nightly   DVT prophylaxis: Lovenox Code Status: Full code Family Communication: Updated patient extensively at bedside  Disposition Plan:  Status is: Inpatient  Remains inpatient appropriate because:Ongoing diagnostic testing needed not appropriate for outpatient work up, Unsafe d/c plan, IV treatments appropriate due to intensity of illness or inability to take PO and Inpatient level of care appropriate due to severity of illness   Dispo: The patient is from: Home              Anticipated d/c is to: Home              Anticipated d/c date is: 1 day              Patient currently is not medically stable to d/c.   Consultants:   None  Procedures:   None  Antimicrobials:   None   Subjective: Patient seen and examined at bedside this morning, resting comfortably.  Eating breakfast.  States breathing has improved gradually since admission, but not yet back to baseline.  Continues on 2 L nasal cannula.  Appetite improved.  No other questions or concerns at this time.  Denies headache, no dizziness, no chest pain, no palpitations, no abdominal pain, no weakness, no fever/chills/night sweats, no nausea/vomiting/diarrhea.  No acute events overnight per nursing staff.  Objective:  Vitals:   09/18/20 1931 09/19/20 0003 09/19/20 0429 09/19/20 0758  BP: 112/73 123/76 124/73 135/73  Pulse: (!) 47 (!) 47 (!) 53 (!) 45  Resp: 16 16 15 20   Temp: 98.2 F (36.8 C) 97.8 F (36.6 C) 98.3 F (36.8 C) 98.2 F (36.8 C)   TempSrc: Oral Oral Oral Oral  SpO2: 99% 99% 98% 96%  Weight:      Height:        Intake/Output Summary (Last 24 hours) at 09/19/2020 1128 Last data filed at 09/18/2020 2115 Gross per 24 hour  Intake 360 ml  Output --  Net 360 ml   Filed Weights   09/15/20 1931  Weight: 128.8 kg    Examination:  General exam: Appears calm and comfortable  Respiratory system: Breath sounds slightly decreased bilateral bases, no wheezing/rhonchi/crackles, normal respiratory effort without accessory muscle use, on 2 L nasal cannula. Cardiovascular system: S1 & S2 heard, RRR. No JVD, murmurs, rubs, gallops or clicks. No pedal edema. Gastrointestinal system: Abdomen is nondistended, soft and nontender. No organomegaly or masses felt. Normal bowel sounds heard. Central nervous system: Alert and oriented. No focal neurological deficits. Extremities: Symmetric 5 x 5 power. Skin: No rashes, lesions or ulcers Psychiatry: Judgement and insight appear normal. Mood & affect appropriate.     Data Reviewed: I have personally reviewed following labs and imaging studies  CBC: Recent Labs  Lab 09/15/20 1937 09/17/20 0406 09/18/20 0547 09/19/20 0545  WBC 3.3* 2.3* 2.4* 2.3*  NEUTROABS 2.9 1.7 2.0 1.8  HGB 12.6 11.9* 11.2* 10.7*  HCT 39.4 35.3* 34.0* 33.5*  MCV 90.2 87.8 88.3 90.3  PLT 132* 151 184 201   Basic Metabolic Panel: Recent Labs  Lab 09/15/20 1937 09/17/20 0406 09/18/20 0547 09/19/20 0545  NA 137 140 140 139  K 3.8 3.8 3.7 3.8  CL 106 108 110 108  CO2 26 23 22 24   GLUCOSE 146* 156* 157* 143*  BUN 12 17 19 19   CREATININE 0.67 0.79 0.84 0.76  CALCIUM 8.3* 8.1* 8.0* 8.0*   GFR: Estimated Creatinine Clearance: 111.8 mL/min (by C-G formula based on SCr of 0.76 mg/dL). Liver Function Tests: Recent Labs  Lab 09/17/20 0406 09/18/20 0547 09/19/20 0545  AST 24 21 26   ALT 17 17 25   ALKPHOS 66 64 59  BILITOT 0.5 0.6 0.5  PROT 6.1* 5.8* 5.7*  ALBUMIN 2.9* 2.9* 2.8*   No results  for input(s): LIPASE, AMYLASE in the last 168 hours. No results for input(s): AMMONIA in the last 168 hours. Coagulation Profile: No results for input(s): INR, PROTIME in the last 168 hours. Cardiac Enzymes: Recent Labs  Lab 09/17/20 0406  CKTOTAL 88   BNP (last 3 results) No results for input(s): PROBNP in the last 8760 hours. HbA1C: No results for input(s): HGBA1C in the last 72 hours. CBG: No results for input(s): GLUCAP in the last 168 hours. Lipid Profile: No results for input(s): CHOL, HDL, LDLCALC, TRIG, CHOLHDL, LDLDIRECT in the last 72 hours. Thyroid Function Tests: No results for input(s): TSH, T4TOTAL, FREET4, T3FREE, THYROIDAB in the last 72 hours. Anemia Panel: Recent Labs    09/18/20 0547 09/19/20 0545  FERRITIN 130 108   Sepsis Labs: No results for input(s): PROCALCITON, LATICACIDVEN in the last 168 hours.  Recent Results (from the past 240 hour(s))  SARS CORONAVIRUS 2 (TAT 6-24 HRS) Nasopharyngeal Nasopharyngeal Swab     Status: Abnormal   Collection Time: 09/10/20  8:38 AM   Specimen: Nasopharyngeal Swab  Result Value  Ref Range Status   SARS Coronavirus 2 POSITIVE (A) NEGATIVE Final    Comment: E MAIL M BROWING @0640  09/11/20 BY S GEZAHEGN (NOTE) SARS-CoV-2 target nucleic acids are DETECTED.  The SARS-CoV-2 RNA is generally detectable in upper and lower respiratory specimens during the acute phase of infection. Positive results are indicative of the presence of SARS-CoV-2 RNA. Clinical correlation with patient history and other diagnostic information is  necessary to determine patient infection status. Positive results do not rule out bacterial infection or co-infection with other viruses.  The expected result is Negative.  Fact Sheet for Patients: 09/13/20  Fact Sheet for Healthcare Providers: HairSlick.no  This test is not yet approved or cleared by the quierodirigir.com FDA and  has  been authorized for detection and/or diagnosis of SARS-CoV-2 by FDA under an Emergency Use Authorization (EUA). This EUA will remain  in effect (meaning this test can be used) for the duration of the COVID-19 declara tion under Section 564(b)(1) of the Act, 21 U.S.C. section 360bbb-3(b)(1), unless the authorization is terminated or revoked sooner.   Performed at [facility], [address], [place], [place]          Radiology Studies: No results found.      Scheduled Meds: . ALPRAZolam  0.5 mg Oral QHS  . vitamin C  500 mg Oral Daily  . aspirin EC  81 mg Oral Daily  . baricitinib  4 mg Oral Daily  . cholecalciferol  1,000 Units Oral Daily  . DULoxetine  90 mg Oral Daily  . enoxaparin (LOVENOX) injection  65 mg Subcutaneous Q24H  . famotidine  20 mg Oral BID  . gabapentin  300 mg Oral BID  . guaiFENesin  600 mg Oral BID  . predniSONE  50 mg Oral Daily  . sertraline  50 mg Oral QHS  . topiramate  200 mg Oral QHS  . traZODone  50 mg Oral QHS  . cyanocobalamin  1,000 mcg Oral Daily  . zinc sulfate  220 mg Oral Daily   Continuous Infusions: . remdesivir 100 mg in NS 100 mL 100 mg (09/19/20 1044)     LOS: 3 days    Time spent: 38 minutes spent on chart review, discussion with nursing staff, consultants, updating family and interview/physical exam; more than 50% of that time was spent in counseling and/or coordination of care.    09/21/20 Alvira Philips, DO [facility] Available via Epic secure chat 7am-7pm After these hours, please refer to coverage provider listed on [email or web address] 09/19/2020, 11:28 AM

## 2020-09-19 NOTE — Plan of Care (Signed)

## 2020-09-19 NOTE — Progress Notes (Signed)
Neuro: AOx4 CV: Huston Foley Resp: RA GI/GU: Independent ambulation to bathroom Skin: Intact Spoke with daughter today. Will continue to monitor.

## 2020-09-20 LAB — CBC WITH DIFFERENTIAL/PLATELET
Abs Immature Granulocytes: 0.06 10*3/uL (ref 0.00–0.07)
Basophils Absolute: 0 10*3/uL (ref 0.0–0.1)
Basophils Relative: 0 %
Eosinophils Absolute: 0 10*3/uL (ref 0.0–0.5)
Eosinophils Relative: 0 %
HCT: 32.6 % — ABNORMAL LOW (ref 36.0–46.0)
Hemoglobin: 11.1 g/dL — ABNORMAL LOW (ref 12.0–15.0)
Immature Granulocytes: 2 %
Lymphocytes Relative: 14 %
Lymphs Abs: 0.4 10*3/uL — ABNORMAL LOW (ref 0.7–4.0)
MCH: 29.5 pg (ref 26.0–34.0)
MCHC: 34 g/dL (ref 30.0–36.0)
MCV: 86.7 fL (ref 80.0–100.0)
Monocytes Absolute: 0.2 10*3/uL (ref 0.1–1.0)
Monocytes Relative: 7 %
Neutro Abs: 2.3 10*3/uL (ref 1.7–7.7)
Neutrophils Relative %: 77 %
Platelets: 237 10*3/uL (ref 150–400)
RBC: 3.76 MIL/uL — ABNORMAL LOW (ref 3.87–5.11)
RDW: 12.8 % (ref 11.5–15.5)
WBC: 3 10*3/uL — ABNORMAL LOW (ref 4.0–10.5)
nRBC: 0 % (ref 0.0–0.2)

## 2020-09-20 LAB — FIBRIN DERIVATIVES D-DIMER (ARMC ONLY): Fibrin derivatives D-dimer (ARMC): 579.13 ng/mL (FEU) — ABNORMAL HIGH (ref 0.00–499.00)

## 2020-09-20 LAB — COMPREHENSIVE METABOLIC PANEL
ALT: 26 U/L (ref 0–44)
AST: 26 U/L (ref 15–41)
Albumin: 2.8 g/dL — ABNORMAL LOW (ref 3.5–5.0)
Alkaline Phosphatase: 56 U/L (ref 38–126)
Anion gap: 6 (ref 5–15)
BUN: 22 mg/dL — ABNORMAL HIGH (ref 6–20)
CO2: 24 mmol/L (ref 22–32)
Calcium: 8 mg/dL — ABNORMAL LOW (ref 8.9–10.3)
Chloride: 110 mmol/L (ref 98–111)
Creatinine, Ser: 0.74 mg/dL (ref 0.44–1.00)
GFR calc Af Amer: >60 mL/min (ref >60)
GFR calc non Af Amer: >60 mL/min (ref >60)
Glucose, Bld: 125 mg/dL — ABNORMAL HIGH (ref 70–99)
Potassium: 3.7 mmol/L (ref 3.5–5.1)
Sodium: 140 mmol/L (ref 135–145)
Total Bilirubin: 0.5 mg/dL (ref 0.3–1.2)
Total Protein: 5.7 g/dL — ABNORMAL LOW (ref 6.5–8.1)

## 2020-09-20 LAB — C-REACTIVE PROTEIN: CRP: 0.6 mg/dL (ref <1.0)

## 2020-09-20 LAB — LACTATE DEHYDROGENASE: LDH: 236 U/L — ABNORMAL HIGH (ref 98–192)

## 2020-09-20 LAB — FERRITIN: Ferritin: 112 ng/mL (ref 11–307)

## 2020-09-20 MED ORDER — ALBUTEROL SULFATE HFA 108 (90 BASE) MCG/ACT IN AERS: 2.0000 | INHALATION_SPRAY | Freq: Four times a day (QID) | RESPIRATORY_TRACT | 0 refills | Status: DC | PRN

## 2020-09-20 MED ORDER — ASCORBIC ACID 500 MG PO TABS: 500.0000 mg | ORAL_TABLET | Freq: Every day | ORAL | 0 refills | Status: AC

## 2020-09-20 MED ORDER — PREDNISONE 10 MG PO TABS: ORAL_TABLET | ORAL | 0 refills | Status: AC

## 2020-09-20 MED ORDER — ZINC SULFATE 220 (50 ZN) MG PO CAPS: 220.0000 mg | ORAL_CAPSULE | Freq: Every day | ORAL | 0 refills | Status: AC

## 2020-09-20 NOTE — Plan of Care (Signed)
  Problem: Education: Goal: Knowledge of General Education information will improve Description: Including pain rating scale, medication(s)/side effects and non-pharmacologic comfort measures 09/20/2020 1005 by Burnett Kanaris, RN Outcome: Progressing 09/20/2020 1004 by Burnett Kanaris, RN Outcome: Progressing   Problem: Health Behavior/Discharge Planning: Goal: Ability to manage health-related needs will improve 09/20/2020 1005 by Burnett Kanaris, RN Outcome: Progressing 09/20/2020 1004 by Burnett Kanaris, RN Outcome: Progressing   Problem: Clinical Measurements: Goal: Ability to maintain clinical measurements within normal limits will improve 09/20/2020 1005 by Burnett Kanaris, RN Outcome: Progressing 09/20/2020 1004 by Burnett Kanaris, RN Outcome: Progressing Goal: Will remain free from infection 09/20/2020 1005 by Burnett Kanaris, RN Outcome: Progressing 09/20/2020 1004 by Burnett Kanaris, RN Outcome: Progressing Goal: Diagnostic test results will improve 09/20/2020 1005 by Burnett Kanaris, RN Outcome: Progressing 09/20/2020 1004 by Burnett Kanaris, RN Outcome: Progressing Goal: Respiratory complications will improve 09/20/2020 1005 by Burnett Kanaris, RN Outcome: Progressing 09/20/2020 1004 by Burnett Kanaris, RN Outcome: Progressing Goal: Cardiovascular complication will be avoided 09/20/2020 1005 by Burnett Kanaris, RN Outcome: Progressing 09/20/2020 1004 by Burnett Kanaris, RN Outcome: Progressing   Problem: Activity: Goal: Risk for activity intolerance will decrease 09/20/2020 1005 by Burnett Kanaris, RN Outcome: Progressing 09/20/2020 1004 by Burnett Kanaris, RN Outcome: Progressing   Problem: Nutrition: Goal: Adequate nutrition will be maintained 09/20/2020 1005 by Burnett Kanaris, RN Outcome: Progressing 09/20/2020 1004 by Burnett Kanaris, RN Outcome: Progressing   Problem: Coping: Goal: Level of anxiety will decrease 09/20/2020 1005 by Burnett Kanaris, RN Outcome:  Progressing 09/20/2020 1004 by Burnett Kanaris, RN Outcome: Progressing   Problem: Elimination: Goal: Will not experience complications related to bowel motility 09/20/2020 1005 by Burnett Kanaris, RN Outcome: Progressing 09/20/2020 1004 by Burnett Kanaris, RN Outcome: Progressing Goal: Will not experience complications related to urinary retention 09/20/2020 1005 by Burnett Kanaris, RN Outcome: Progressing 09/20/2020 1004 by Burnett Kanaris, RN Outcome: Progressing   Problem: Pain Managment: Goal: General experience of comfort will improve 09/20/2020 1005 by Burnett Kanaris, RN Outcome: Progressing 09/20/2020 1004 by Burnett Kanaris, RN Outcome: Progressing   Problem: Safety: Goal: Ability to remain free from injury will improve 09/20/2020 1005 by Burnett Kanaris, RN Outcome: Progressing 09/20/2020 1004 by Burnett Kanaris, RN Outcome: Progressing   Problem: Skin Integrity: Goal: Risk for impaired skin integrity will decrease 09/20/2020 1005 by Burnett Kanaris, RN Outcome: Progressing 09/20/2020 1004 by Burnett Kanaris, RN Outcome: Progressing   Problem: Education: Goal: Knowledge of risk factors and measures for prevention of condition will improve 09/20/2020 1005 by Burnett Kanaris, RN Outcome: Progressing 09/20/2020 1004 by Burnett Kanaris, RN Outcome: Progressing   Problem: Coping: Goal: Psychosocial and spiritual needs will be supported 09/20/2020 1005 by Burnett Kanaris, RN Outcome: Progressing 09/20/2020 1004 by Burnett Kanaris, RN Outcome: Progressing   Problem: Respiratory: Goal: Will maintain a patent airway 09/20/2020 1005 by Burnett Kanaris, RN Outcome: Progressing 09/20/2020 1004 by Burnett Kanaris, RN Outcome: Progressing Goal: Complications related to the disease process, condition or treatment will be avoided or minimized 09/20/2020 1005 by Burnett Kanaris, RN Outcome: Progressing 09/20/2020 1004 by Burnett Kanaris, RN Outcome: Progressing

## 2020-09-20 NOTE — Discharge Summary (Signed)
Physician Discharge Summary  Jill Garcia:[id number] DOB: Jun 15, 1966 DOA: 09/16/2020  PCP: Roanna Raider, PA  Admit date: 09/16/2020 Discharge date: 09/20/2020  Admitted From: Home Disposition: Home  Recommendations for Outpatient Follow-up:  1. Follow up with PCP in 1-2 weeks 2. Continue prednisone taper on discharge 3. Continue zinc, vitamin C, albuterol MDI as needed 4. Recommend obtaining Covid-19 vaccination in 45 days   Home Health: No Equipment/Devices: None  Discharge Condition: Stable CODE STATUS: Full code Diet recommendation: Regular diet  History of present illness:  Jill Garcia is a 54 year old female with past medical history notable for anxiety/depression, fibromyalgia, migraine headache who presented to the ED with progressive myalgias, shortness of breath, and cough.  Patient was recently diagnosed with Covid-19 virus, and received a monoclonal antibody infusion.  In the ED, patient was noted to have a elevated respiratory rate of 26, with hypoxia with SPO2 89% on room air at rest.  Chest x-ray with findings consistent with multifocal pneumonia. Patient was given IV LR bolus, IV Solu-Medrol and remdesivir.  TRH consulted for admission.  Hospital course:  Acute hypoxic respiratory failure secondary to acute Covid-19 viral pneumonia during the ongoing 2020 Covid 19 Pandemic - POA Patient presenting with progressive shortness of breath, found to be hypoxic with SPO2 89% on room air.  Chest x-ray consistent with multifocal pneumonia.  Covid-19 PCR positive on 09/10/2020.  Patient completed 5-day course of remdesivir.  She was started on IV steroids and baricitinib.  Patient's inflammatory markers were monitored and declined during her hospitalization.  She was slowly weaned off of supplemental oxygen and did not require oxygen with exertion at time of discharge.  Patient will continue a prednisone taper on discharge.  Recommend Covid-19 vaccination in 45 days.   Outpatient follow-up with PCP.  Continue home isolation for 14 days from initial diagnosis on 09/10/2020.  The treatment plan and use of medications and known side effects were discussed with patient/family. Some of the medications used are based on case reports/anecdotal data.  All other medications being used in the management of COVID-19 based on limited study data.  Complete risks and long-term side effects are unknown, however in the best clinical judgment they seem to be of some benefit.  Patient wanted to proceed with treatment options provided.  Anxiety/depression: Continue home alprazolam 0.5 mg nightly, Duloxetine 90 mg daily, Zoloft 50 mg p.o. daily  Fibromyalgia Continune home chlorzoxazone 500mg  q6h prn muscle spasms  Insomnia: Trazodone 50 mg nightly  Hx migraine headache: Continue Topamax 200 mg p.o. nightly  Discharge Diagnoses:  Active Problems:   * No active hospital problems. *    Discharge Instructions  Discharge Instructions    Call MD for:  difficulty breathing, headache or visual disturbances   Complete by: As directed    Call MD for:  extreme fatigue   Complete by: As directed    Call MD for:  persistant dizziness or light-headedness   Complete by: As directed    Call MD for:  persistant nausea and vomiting   Complete by: As directed    Call MD for:  severe uncontrolled pain   Complete by: As directed    Call MD for:  temperature >100.4   Complete by: As directed    Diet - low sodium heart healthy   Complete by: As directed    Increase activity slowly   Complete by: As directed      Allergies as of 09/20/2020      Reactions  Tape Hives   Clindamycin Other (See Comments)   Pt does not remember, possible itching Pt does not remember, possible itching      Medication List    STOP taking these medications   doxycycline 100 MG capsule Commonly known as: VIBRAMYCIN     TAKE these medications   Aimovig 140 MG/ML Soaj Generic drug:  Erenumab-aooe SMARTSIG:140 Milligram(s) SUB-Q Once a Month   albuterol 108 (90 Base) MCG/ACT inhaler Commonly known as: VENTOLIN HFA Inhale 2 puffs into the lungs every 6 (six) hours as needed for wheezing or shortness of breath.   ALPRAZolam 0.5 MG tablet Commonly known as: XANAX Take 0.5 mg by mouth at bedtime.   ascorbic acid 500 MG tablet Commonly known as: VITAMIN C Take 1 tablet (500 mg total) by mouth daily. Start taking on: September 21, 2020   chlorzoxazone 500 MG tablet Commonly known as: PARAFON Take 500 mg by mouth every 6 (six) hours as needed for muscle spasms.   cyanocobalamin 1000 MCG tablet Take 1,000 mcg by mouth daily.   DULoxetine 60 MG capsule Commonly known as: CYMBALTA Take 90 mg by mouth daily.   gabapentin 300 MG capsule Commonly known as: NEURONTIN Take 300 mg by mouth as directed. Take 300 mg by mouth as directed Take in the evening as directed (Day 1: 300mg . Day 2: 600mg . Day 3-6: 900mg . Day 7-10: 1200mg . Day 11-14: 1500mg . Day 15+: 1800mg )   nitroGLYCERIN 0.4 MG SL tablet Commonly known as: NITROSTAT SMARTSIG:1 Tablet(s) Sublingual PRN   predniSONE 10 MG tablet Commonly known as: DELTASONE Take 5 tablets (50 mg total) by mouth daily for 1 day, THEN 4 tablets (40 mg total) daily for 3 days, THEN 3 tablets (30 mg total) daily for 3 days, THEN 2 tablets (20 mg total) daily for 3 days, THEN 1 tablet (10 mg total) daily for 3 days. Start taking on: September 21, 2020   sertraline 50 MG tablet Commonly known as: ZOLOFT Take 50 mg by mouth at bedtime.   Sprix 15.75 MG/SPRAY Soln Generic drug: Ketorolac Tromethamine Use ONE psray IN each nostril EVERY 8 HOURS AS NEEDED for migraine for 5 days   tiZANidine 2 MG tablet Commonly known as: ZANAFLEX Take 2 mg by mouth 3 (three) times daily as needed.   topiramate 200 MG tablet Commonly known as: TOPAMAX Take 200 mg by mouth at bedtime.   traZODone 50 MG tablet Commonly known as: DESYREL Take 50 mg  by mouth at bedtime.   zinc sulfate 220 (50 Zn) MG capsule Take 1 capsule (220 mg total) by mouth daily. Start taking on: September 21, 2020            Durable Medical Equipment  (From admission, onward)         Start     Ordered   09/19/20 1742  For home use only DME Walker rolling  Once       Question Answer Comment  Walker: With 5 Inch Wheels   Patient needs a walker to treat with the following condition Gait disorder      09/19/20 1742   09/19/20 1742  For home use only DME Shower stool  Once        09/19/20 1742   09/19/20 1742  For home use only DME 3 n 1  Once        09/19/20 1742          Follow-up Information    09/21/20, 09/21/20. Call in 1  week(s).   Specialty: Physician Assistant Contact information: [address] [place] [id number] [phone]              Allergies  Allergen Reactions  . Tape Hives  . Clindamycin Other (See Comments)    Pt does not remember, possible itching Pt does not remember, possible itching     Consultations:  None   Procedures/Studies: CT Angio Chest PE W and/or Wo Contrast  Result Date: 09/16/2020 CLINICAL DATA:  PE suspected. Low/intermediate probability for pulmonary embolism. Positive D-dimer. EXAM: CT ANGIOGRAPHY CHEST WITH CONTRAST TECHNIQUE: Multidetector CT imaging of the chest was performed using the standard protocol during bolus administration of intravenous contrast. Multiplanar CT image reconstructions and MIPs were obtained to evaluate the vascular anatomy. CONTRAST:  100mL OMNIPAQUE IOHEXOL 350 MG/ML SOLN COMPARISON:  None. FINDINGS: Cardiovascular: Satisfactory opacification of the pulmonary arteries to the segmental level. No evidence of pulmonary embolism. Normal heart size. No pericardial effusion. Mediastinum/Nodes: Negative for adenopathy or mass Lungs/Pleura: Patchy ground-glass opacity throughout all lobes with intervening normal lung. No air leak Upper Abdomen: Gastric bypass. 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 Musculoskeletal: Negative Review of the MIP images confirms the above findings. IMPRESSION: 1. COVID pneumonia. 2. Negative for pulmonary embolism. Electronically Signed   By: Marnee SpringJonathon  Watts M.D.   On: 09/16/2020 07:19   DG Chest Portable 1 View  Result Date: 09/16/2020 CLINICAL DATA:  Shortness of breath and COVID positive. EXAM: PORTABLE CHEST 1 VIEW COMPARISON:  12/29/2019 FINDINGS: Shallow inspiration. Cardiac enlargement. Patchy perihilar and basilar infiltrates consistent with multifocal COVID pneumonia. No pleural effusions. No pneumothorax. Mediastinal contours appear intact. IMPRESSION: Patchy perihilar and basilar infiltrates consistent with multifocal COVID pneumonia. Electronically Signed   By: Burman NievesWilliam  Stevens M.D.   On: 09/16/2020 02:15      Subjective: Patient seen and examined bedside, resting comfortably.  No complaints this morning.  Breathing improved, oxygenating well on room air.  Ready for discharge home.  Denies headache, no visual changes, no chest pain, palpitations, no shortness of breath, no abdominal pain, no weakness, no fatigue, no paresthesias.  No acute events overnight per nursing staff.  Discharge Exam: Vitals:   09/20/20 0810 09/20/20 0822  BP: 131/76   Pulse: (!) 50 71  Resp: (!) 21 (!) 22  Temp: 98 F (36.7 C)   SpO2: 91% 94%   Vitals:   09/20/20 0630 09/20/20 0712 09/20/20 0810 09/20/20 0822  BP: 118/67  131/76   Pulse: (!) 56  (!) 50 71  Resp: 18  (!) 21 (!) 22  Temp: 97.9 F (36.6 C)  98 F (36.7 C)   TempSrc: Oral  Oral   SpO2: 91% 94% 91% 94%  Weight:      Height:        General: Pt is alert, awake, not in acute distress Cardiovascular: RRR, S1/S2 +, no rubs, no gallops Respiratory: CTA bilaterally, no wheezing, no rhonchi Abdominal: Soft, NT, ND, bowel sounds + Extremities: no edema, no cyanosis    The results of significant  diagnostics from this hospitalization (including imaging, microbiology, ancillary and laboratory) are listed below for reference.     Microbiology: No results found for this or any previous visit (from the past 240 hour(s)).   Labs: BNP (last 3 results) No results for input(s): BNP in the last 8760 hours. Basic Metabolic Panel: Recent Labs  Lab 09/15/20 1937 09/17/20 0406 09/18/20 0547 09/19/20 0545  09/20/20 0314  NA 137 140 140 139 140  K 3.8 3.8 3.7 3.8 3.7  CL 106 108 110 108 110  CO2 26 23 22 24 24   GLUCOSE 146* 156* 157* 143* 125*  BUN 12 17 19 19  22*  CREATININE 0.67 0.79 0.84 0.76 0.74  CALCIUM 8.3* 8.1* 8.0* 8.0* 8.0*   Liver Function Tests: Recent Labs  Lab 09/17/20 0406 09/18/20 0547 09/19/20 0545 09/20/20 0314  AST 24 21 26 26   ALT 17 17 25 26   ALKPHOS 66 64 59 56  BILITOT 0.5 0.6 0.5 0.5  PROT 6.1* 5.8* 5.7* 5.7*  ALBUMIN 2.9* 2.9* 2.8* 2.8*   No results for input(s): LIPASE, AMYLASE in the last 168 hours. No results for input(s): AMMONIA in the last 168 hours. CBC: Recent Labs  Lab 09/15/20 1937 09/17/20 0406 09/18/20 0547 09/19/20 0545 09/20/20 0314  WBC 3.3* 2.3* 2.4* 2.3* 3.0*  NEUTROABS 2.9 1.7 2.0 1.8 2.3  HGB 12.6 11.9* 11.2* 10.7* 11.1*  HCT 39.4 35.3* 34.0* 33.5* 32.6*  MCV 90.2 87.8 88.3 90.3 86.7  PLT 132* 151 184 201 237   Cardiac Enzymes: Recent Labs  Lab 09/17/20 0406  CKTOTAL 88   BNP: Invalid input(s): POCBNP CBG: No results for input(s): GLUCAP in the last 168 hours. D-Dimer No results for input(s): DDIMER in the last 72 hours. Hgb A1c No results for input(s): HGBA1C in the last 72 hours. Lipid Profile No results for input(s): CHOL, HDL, LDLCALC, TRIG, CHOLHDL, LDLDIRECT in the last 72 hours. Thyroid function studies No results for input(s): TSH, T4TOTAL, T3FREE, THYROIDAB in the last 72 hours.  Invalid input(s): FREET3 Anemia work up Recent Labs    09/19/20 0545 09/20/20 0314  FERRITIN 108 112    Urinalysis    Component Value Date/Time   COLORURINE AMBER (A) 09/15/2020 1937   APPEARANCEUR CLOUDY (A) 09/15/2020 1937   LABSPEC 1.033 (H) 09/15/2020 1937   PHURINE 5.0 09/15/2020 1937   GLUCOSEU NEGATIVE 09/15/2020 1937   HGBUR NEGATIVE 09/15/2020 1937   BILIRUBINUR NEGATIVE 09/15/2020 1937   KETONESUR NEGATIVE 09/15/2020 1937   PROTEINUR 100 (A) 09/15/2020 1937   NITRITE NEGATIVE 09/15/2020 1937   LEUKOCYTESUR NEGATIVE 09/15/2020 1937   Sepsis Labs Invalid input(s): PROCALCITONIN,  WBC,  LACTICIDVEN Microbiology No results found for this or any previous visit (from the past 240 hour(s)).   Time coordinating discharge: Over 30 minutes  SIGNED:   09/17/2020 09/17/2020, DO  [facility] 09/20/2020, 10:49 AM

## 2020-09-20 NOTE — TOC Transition Note (Addendum)
Transition of Care [facility]) - CM/SW Discharge Note   Patient Details  Name: Jill Garcia MRN: [id number] Date of Birth: [birth year]  [facility] CM/SW Contact:  Allayne Butcher, RN Phone Number: 09/20/2020, 12:20 PM   Clinical Narrative:    Patient is medically cleared for discharge home with home health services.  [place] with [facility] is aware of discharge today.  Patient did not need home O2 and other equipment will be delivered to the home.    Final next level of care: Home w Home Health Services Barriers to Discharge: No Barriers Identified   Patient Goals and CMS Choice Patient states their goals for this hospitalization and ongoing recovery are:: Patient wants to get to feeling back to normal CMS Medicare.gov Compare Post Acute Care list provided to:: Patient Choice offered to / list presented to : Patient  Discharge Placement                       Discharge Plan and Services   Discharge Planning Services: CM Consult Post Acute Care Choice: Home Health          DME: Rollator, 3 in 1, shower stool DME Agency: [employer] Date DME Agency Contacted: 09/20/20 Time DME Agency Contacted: [phone] Representative spoke with at DME Agency: Oletha Cruel HH Arranged: RN, PT, OT [facility] Agency: [facility] Date [facility] Agency Contacted: 09/20/20 Time HH Agency Contacted: [phone] Representative spoke with at [facility] Agency: Christy Gentles  Social Determinants of Health (SDOH) Interventions     Readmission Risk Interventions No flowsheet data found.

## 2020-09-20 NOTE — Progress Notes (Signed)
Pt being discharged home, discharge instructions reviewed with pt, states understanding, pt with no complaints 

## 2020-10-13 ENCOUNTER — Other Ambulatory Visit: Payer: Self-pay

## 2020-11-21 ENCOUNTER — Other Ambulatory Visit: Payer: Self-pay

## 2020-11-21 ENCOUNTER — Ambulatory Visit
Admission: EM | Admit: 2020-11-21 | Discharge: 2020-11-21 | Disposition: A | Discharge status: Home / Self Care | Payer: Medicare HMO | Attending: Emergency Medicine | Admitting: Emergency Medicine

## 2020-11-21 DIAGNOSIS — Z888 Allergy status to other drugs, medicaments and biological substances status: Secondary | ICD-10-CM | POA: Diagnosis not present

## 2020-11-21 DIAGNOSIS — Z79899 Other long term (current) drug therapy: Secondary | ICD-10-CM | POA: Diagnosis not present

## 2020-11-21 DIAGNOSIS — J069 Acute upper respiratory infection, unspecified: Secondary | ICD-10-CM | POA: Insufficient documentation

## 2020-11-21 DIAGNOSIS — R42 Dizziness and giddiness: Secondary | ICD-10-CM | POA: Insufficient documentation

## 2020-11-21 DIAGNOSIS — Z881 Allergy status to other antibiotic agents status: Secondary | ICD-10-CM | POA: Insufficient documentation

## 2020-11-21 DIAGNOSIS — R0981 Nasal congestion: Secondary | ICD-10-CM | POA: Diagnosis present

## 2020-11-21 DIAGNOSIS — Z8616 Personal history of COVID-19: Secondary | ICD-10-CM | POA: Insufficient documentation

## 2020-11-21 DIAGNOSIS — Z20822 Contact with and (suspected) exposure to covid-19: Secondary | ICD-10-CM | POA: Insufficient documentation

## 2020-11-21 LAB — RESP PANEL BY RT-PCR (FLU A&B, COVID) ARPGX2
Influenza A by PCR: NEGATIVE
Influenza B by PCR: NEGATIVE
SARS Coronavirus 2 by RT PCR: NEGATIVE

## 2020-11-21 MED ORDER — IPRATROPIUM BROMIDE 0.06 % NA SOLN: 2.0000 | Freq: Four times a day (QID) | NASAL | 12 refills | Status: DC

## 2020-11-21 MED ORDER — PSEUDOEPHEDRINE-GUAIFENESIN ER 60-600 MG PO TB12: 1.0000 | ORAL_TABLET | Freq: Two times a day (BID) | ORAL | 0 refills | Status: AC

## 2020-11-21 NOTE — ED Triage Notes (Signed)
Pt reports having nasal congestion and headache x5 days. Also reports having some pressure in both ears. Denies fevers, SOB.

## 2020-11-21 NOTE — Discharge Instructions (Signed)
Continue at home meclizine which should help with the dizziness.  You can take this medication 4 times a day.  Increase fluid intake and rest.  Take Mucinex D.  I am not sure if your insurance will cover it, but I have sent it to the pharmacy.  I also sent Atrovent nasal spray which should be covered by your insurance.  Sit in the room with warm steam and consider use of a humidifier to help open up your sinus/nasal passages.  Follow-up if your symptoms are not better in 5 more days.  At that time, we can consider an antibiotic.  Follow-up sooner for any worsening symptoms.

## 2020-11-21 NOTE — ED Provider Notes (Addendum)
[facility]    CSN: [id number] Arrival date & time: 11/21/20  6073      History   Chief Complaint Chief Complaint  Patient presents with  . Nasal Congestion    HPI Jill Garcia is a 54 y.o. female with self-reported 5-day history of nasal congestion and headaches.  She also admits to pressure in bilateral ears.  She also reports dizziness with certain movements of her head.  She says she has had this before and is taken meclizine for it.  She reports taking meclizine once yesterday and says she did not think helped.  She is also taken some off brand decongestant medication which she is not sure the name of.  Patient does have personal history of COVID-19 infection at the end of September 2021.  She was actually admitted to the hospital for acute respiratory failure and stayed for about 3 days.  Patient states she fully recovered.  She is not vaccinated for Covid.  Denies any recent Covid exposure.  Patient denies any fever, body aches, fatigue, chest discomfort, shortness of breath or wheezing.  She does admit to some sinus pressure.  She has no other complaints or concerns at this time.  HPI  Past Medical History:  Diagnosis Date  . Anxiety   . Depression   . Fibromyalgia   . Insomnia   . Migraines   . Pseudotumor     There are no problems to display for this patient.   Past Surgical History:  Procedure Laterality Date  . LEG SURGERY    . SMALL INTESTINE SURGERY      OB History   No obstetric history on file.      Home Medications    Prior to Admission medications   Medication Sig Start Date End Date Taking? Authorizing Provider  AIMOVIG 140 MG/ML SOAJ SMARTSIG:140 Milligram(s) SUB-Q Once a Month 08/31/20   [provider]  albuterol (VENTOLIN HFA) 108 (90 Base) MCG/ACT inhaler Inhale 2 puffs into the lungs every 6 (six) hours as needed for wheezing or shortness of breath. 09/20/20   [place], Alvira Philips, DO  ALPRAZolam Prudy Feeler) 0.5 MG tablet  Take 0.5 mg by mouth at bedtime.     [provider]  chlorzoxazone (PARAFON) 500 MG tablet Take 500 mg by mouth every 6 (six) hours as needed for muscle spasms.  08/08/20   [provider]  cyanocobalamin 1000 MCG tablet Take 1,000 mcg by mouth daily.    [provider]  DULoxetine (CYMBALTA) 60 MG capsule Take 90 mg by mouth daily.    [provider]  gabapentin (NEURONTIN) 300 MG capsule Take 300 mg by mouth as directed. Take 300 mg by mouth as directed Take in the evening as directed (Day 1: 300mg . Day 2: 600mg . Day 3-6: 900mg . Day 7-10: 1200mg . Day 11-14: 1500mg . Day 15+: 1800mg ) 08/01/20   [provider]  ipratropium (ATROVENT) 0.06 % nasal spray Place 2 sprays into both nostrils 4 (four) times daily. 11/21/20   , PA-C  Ketorolac Tromethamine (SPRIX) 15.75 MG/SPRAY SOLN Use ONE psray IN each nostril EVERY 8 HOURS AS NEEDED for migraine for 5 days 02/10/20   [provider]  nitroGLYCERIN (NITROSTAT) 0.4 MG SL tablet SMARTSIG:1 Tablet(s) Sublingual PRN 04/03/20   [provider]  pseudoephedrine-guaifenesin (MUCINEX D) 60-600 MG 12 hr tablet Take 1 tablet by mouth every 12 (twelve) hours for 7 days. 11/21/20 11/28/20  14/1/21 B, PA-C  sertraline (ZOLOFT) 50 MG tablet Take  50 mg by mouth at bedtime. 06/17/20   [provider]  tiZANidine (ZANAFLEX) 2 MG tablet Take 2 mg by mouth 3 (three) times daily as needed. 05/12/20   [provider]  topiramate (TOPAMAX) 200 MG tablet Take 200 mg by mouth at bedtime. 08/02/20   [provider]  traZODone (DESYREL) 50 MG tablet Take 50 mg by mouth at bedtime.     [provider]    Family History No family history on file.  Social History Social History   Tobacco Use  . Smoking status: Never Smoker  . Smokeless tobacco: Never Used  Vaping Use  . Vaping Use: Never used  Substance Use Topics  . Alcohol use: Never  . Drug use: Never      Allergies   Tape and Clindamycin   Review of Systems Review of Systems  Constitutional: Negative for chills, diaphoresis, fatigue and fever.  HENT: Positive for congestion, rhinorrhea and sinus pressure. Negative for ear pain, sinus pain and sore throat.   Respiratory: Negative for cough and shortness of breath.   Gastrointestinal: Negative for abdominal pain, nausea and vomiting.  Musculoskeletal: Negative for arthralgias and myalgias.  Skin: Negative for rash.  Neurological: Positive for headaches. Negative for weakness.  Hematological: Negative for adenopathy.     Physical Exam Triage Vital Signs ED Triage Vitals  Enc Vitals Group     BP 11/21/20 0934 111/77     Pulse Rate 11/21/20 0934 (!) 56     Resp 11/21/20 0934 18     Temp 11/21/20 0934 98 F (36.7 C)     Temp Source 11/21/20 0934 Oral     SpO2 11/21/20 0934 100 %     Weight 11/21/20 0935 281 lb (127.5 kg)     Height 11/21/20 0935 5\' 6"  (1.676 m)     Head Circumference --      Peak Flow --      Pain Score 11/21/20 0934 6     Pain Loc --      Pain Edu? --      Excl. in GC? --    No data found.  Updated Vital Signs BP 111/77   Pulse (!) 56   Temp 98 F (36.7 C) (Oral)   Resp 18   Ht 5\' 6"  (1.676 m)   Wt 281 lb (127.5 kg)   SpO2 100%   BMI 45.35 kg/m       Physical Exam Vitals and nursing note reviewed.  Constitutional:      General: She is not in acute distress.    Appearance: Normal appearance. She is not ill-appearing or toxic-appearing.  HENT:     Head: Normocephalic and atraumatic.     Right Ear: Hearing and ear canal normal. A middle ear effusion is present.     Left Ear: Hearing and ear canal normal. A middle ear effusion is present.     Nose: Congestion (swollen and erythematous nasal mucosa) and rhinorrhea (mild clear drainage) present.     Right Sinus: Maxillary sinus tenderness present.     Left Sinus: Maxillary sinus tenderness present.     Mouth/Throat:     Mouth: Mucous  membranes are moist.     Pharynx: Oropharynx is clear. Posterior oropharyngeal erythema present.  Eyes:     General: No scleral icterus.       Right eye: No discharge.        Left eye: No discharge.     Conjunctiva/sclera: Conjunctivae normal.  Cardiovascular:  Rate and Rhythm: Regular rhythm. Bradycardia present.     Heart sounds: Normal heart sounds.  Pulmonary:     Effort: Pulmonary effort is normal. No respiratory distress.     Breath sounds: Normal breath sounds.  Musculoskeletal:     Cervical back: Neck supple.  Skin:    General: Skin is dry.  Neurological:     General: No focal deficit present.     Mental Status: She is alert. Mental status is at baseline.     Motor: No weakness.     Gait: Gait normal.  Psychiatric:        Mood and Affect: Mood normal.        Behavior: Behavior normal.        Thought Content: Thought content normal.      UC Treatments / Results  Labs (all labs ordered are listed, but only abnormal results are displayed) Labs Reviewed  RESP PANEL BY RT-PCR (FLU A&B, COVID) ARPGX2    EKG   Radiology No results found.  Procedures Procedures (including critical care time)  Medications Ordered in UC Medications - No data to display  Initial Impression / Assessment and Plan / UC Course  I have reviewed the triage vital signs and the nursing notes.  Pertinent labs & imaging results that were available during my care of the patient were reviewed by me and considered in my medical decision making (see chart for details).   54 year old female with 5-day history of nasal/sinus congestion.  On exam, she does have moderate nasal congestion with erythematous and swollen nasal mucosa.  There is mild clear rhinorrhea.  She does have some mild sinus tenderness of the left and right which is worse on the left.  Suspect viral sinusitis.  Advised her to take over-the-counter Mucinex D and increase fluids and rest.  Advised to continue the meclizine at  home.  Advised switching to Atrovent nasal spray if Flonase does not help.  Advised that symptoms should clear up over the next 5 days or so.  Advised that they do not, she can follow-up with PCP or return to our office and we can consider antibiotic.  Antibiotics not indicated at this time.  Will call with results of respiratory panel positive.  Respiratory panel is all negative. . Final Clinical Impressions(s) / UC Diagnoses   Final diagnoses:  Viral upper respiratory tract infection  Nasal congestion  Dizziness     Discharge Instructions     Continue at home meclizine which should help with the dizziness.  You can take this medication 4 times a day.  Increase fluid intake and rest.  Take Mucinex D.  I am not sure if your insurance will cover it, but I have sent it to the pharmacy.  I also sent Atrovent nasal spray which should be covered by your insurance.  Sit in the room with warm steam and consider use of a humidifier to help open up your sinus/nasal passages.  Follow-up if your symptoms are not better in 5 more days.  At that time, we can consider an antibiotic.  Follow-up sooner for any worsening symptoms.    ED Prescriptions    Medication Sig Dispense Auth. Provider   pseudoephedrine-guaifenesin (MUCINEX D) 60-600 MG 12 hr tablet Take 1 tablet by mouth every 12 (twelve) hours for 7 days. 14 tablet Eusebio Friendly B, PA-C   ipratropium (ATROVENT) 0.06 % nasal spray Place 2 sprays into both nostrils 4 (four) times daily. 15 mL Shirlee Latch, PA-C  PDMP not reviewed this encounter.   Shirlee Latch, PA-C 11/21/20 1017    Eusebio Friendly B, PA-C 11/21/20 1451

## 2020-12-24 ENCOUNTER — Ambulatory Visit
Admission: EM | Admit: 2020-12-24 | Discharge: 2020-12-24 | Disposition: A | Discharge status: Home / Self Care | Payer: Medicare HMO | Attending: Sports Medicine | Admitting: Sports Medicine

## 2020-12-24 ENCOUNTER — Encounter: Payer: Self-pay | Admitting: Emergency Medicine

## 2020-12-24 ENCOUNTER — Other Ambulatory Visit: Payer: Self-pay

## 2020-12-24 DIAGNOSIS — Z20822 Contact with and (suspected) exposure to covid-19: Secondary | ICD-10-CM | POA: Diagnosis not present

## 2020-12-24 DIAGNOSIS — R6883 Chills (without fever): Secondary | ICD-10-CM | POA: Diagnosis present

## 2020-12-24 DIAGNOSIS — B349 Viral infection, unspecified: Secondary | ICD-10-CM | POA: Diagnosis present

## 2020-12-24 DIAGNOSIS — R519 Headache, unspecified: Secondary | ICD-10-CM | POA: Diagnosis present

## 2020-12-24 NOTE — Discharge Instructions (Addendum)

## 2020-12-24 NOTE — ED Triage Notes (Signed)
Pt c/o runny nose, chills, headache. Started yesterday. She states the chills feel like she did when she had covid.

## 2020-12-24 NOTE — ED Provider Notes (Signed)
[facility]    CSN: [id number] Arrival date & time: 12/24/20  0831      History   Chief Complaint Chief Complaint  Patient presents with  . Chills    HPI Jill Garcia is a 55 y.o. female presenting for onset of chills, headaches, and runny nose yesterday.  Patient states her symptoms feel similar to when she had COVID-19 in September 2021.  She was actually admitted to the hospital that time for acute respiratory failure.  She denies any known COVID-19 exposure.  She denies Covid vaccination.  Patient denies any fever, fatigue, body aches, sore throat, cough, chest pain, breathing difficulty, abdominal pain, N/V/D, or changes in smell or taste. Not taking any OTC meds for symptoms. Patient has no other complaints of concerns.  HPI  Past Medical History:  Diagnosis Date  . Anxiety   . Depression   . Fibromyalgia   . Insomnia   . Migraines   . Pseudotumor     There are no problems to display for this patient.   Past Surgical History:  Procedure Laterality Date  . LEG SURGERY    . SMALL INTESTINE SURGERY      OB History   No obstetric history on file.      Home Medications    Prior to Admission medications   Medication Sig Start Date End Date Taking? Authorizing Provider  AIMOVIG 140 MG/ML SOAJ SMARTSIG:140 Milligram(s) SUB-Q Once a Month 08/31/20  Yes [provider]  albuterol (VENTOLIN HFA) 108 (90 Base) MCG/ACT inhaler Inhale 2 puffs into the lungs every 6 (six) hours as needed for wheezing or shortness of breath. 09/20/20  Yes [place], Eric J, DO  ALPRAZolam Prudy Feeler) 0.5 MG tablet Take 0.5 mg by mouth at bedtime.    Yes [provider]  chlorzoxazone (PARAFON) 500 MG tablet Take 500 mg by mouth every 6 (six) hours as needed for muscle spasms.  08/08/20  Yes [provider]  cyanocobalamin 1000 MCG tablet Take 1,000 mcg by mouth daily.   Yes [provider]  DULoxetine (CYMBALTA) 60 MG capsule Take 90 mg by mouth  daily.   Yes [provider]  gabapentin (NEURONTIN) 300 MG capsule Take 300 mg by mouth as directed. Take 300 mg by mouth as directed Take in the evening as directed (Day 1: 300mg . Day 2: 600mg . Day 3-6: 900mg . Day 7-10: 1200mg . Day 11-14: 1500mg . Day 15+: 1800mg ) 08/01/20  Yes [provider]  Ketorolac Tromethamine 15.75 MG/SPRAY SOLN Use ONE psray IN each nostril EVERY 8 HOURS AS NEEDED for migraine for 5 days 02/10/20  Yes [provider]  nitroGLYCERIN (NITROSTAT) 0.4 MG SL tablet SMARTSIG:1 Tablet(s) Sublingual PRN 04/03/20  Yes [provider]  sertraline (ZOLOFT) 50 MG tablet Take 50 mg by mouth at bedtime. 06/17/20  Yes [provider]  tiZANidine (ZANAFLEX) 2 MG tablet Take 2 mg by mouth 3 (three) times daily as needed. 05/12/20  Yes [provider]  traZODone (DESYREL) 50 MG tablet Take 50 mg by mouth at bedtime.    Yes [provider]  ipratropium (ATROVENT) 0.06 % nasal spray Place 2 sprays into both nostrils 4 (four) times daily. 11/21/20   10/01/20, PA-C  topiramate (TOPAMAX) 200 MG tablet Take 200 mg by mouth at bedtime. 08/02/20   [provider]    Family History No family history on file.  Social History Social History   Tobacco Use  . Smoking status: Never Smoker  . Smokeless  tobacco: Never Used  Vaping Use  . Vaping Use: Never used  Substance Use Topics  . Alcohol use: Never  . Drug use: Never     Allergies   Tape and Clindamycin   Review of Systems Review of Systems  Constitutional: Positive for chills. Negative for diaphoresis, fatigue and fever.  HENT: Positive for congestion and rhinorrhea. Negative for ear pain, sinus pressure, sinus pain and sore throat.   Respiratory: Negative for cough and shortness of breath.   Gastrointestinal: Negative for abdominal pain, nausea and vomiting.  Musculoskeletal: Negative for arthralgias and myalgias.  Skin: Negative for rash.  Neurological:  Positive for headaches. Negative for weakness.  Hematological: Negative for adenopathy.     Physical Exam Triage Vital Signs ED Triage Vitals  Enc Vitals Group     BP 12/24/20 0921 124/84     Pulse Rate 12/24/20 0921 61     Resp 12/24/20 0921 18     Temp 12/24/20 0921 98.2 F (36.8 C)     Temp Source 12/24/20 0921 Oral     SpO2 12/24/20 0921 100 %     Weight 12/24/20 0918 281 lb 1.4 oz (127.5 kg)     Height 12/24/20 0918 5\' 6"  (1.676 m)     Head Circumference --      Peak Flow --      Pain Score 12/24/20 0918 6     Pain Loc --      Pain Edu? --      Excl. in [place]? --    No data found.  Updated Vital Signs BP 124/84 (BP Location: Left Arm)   Pulse 61   Temp 98.2 F (36.8 C) (Oral)   Resp 18   Ht 5\' 6"  (1.676 m)   Wt 281 lb 1.4 oz (127.5 kg)   SpO2 100%   BMI 45.37 kg/m       Physical Exam Vitals and nursing note reviewed.  Constitutional:      General: She is not in acute distress.    Appearance: Normal appearance. She is not ill-appearing or toxic-appearing.  HENT:     Head: Normocephalic and atraumatic.     Nose: Rhinorrhea (trace clear drainage) present.     Mouth/Throat:     Mouth: Mucous membranes are moist.     Pharynx: Oropharynx is clear.  Eyes:     General: No scleral icterus.       Right eye: No discharge.        Left eye: No discharge.     Conjunctiva/sclera: Conjunctivae normal.  Cardiovascular:     Rate and Rhythm: Normal rate and regular rhythm.     Heart sounds: Normal heart sounds.  Pulmonary:     Effort: Pulmonary effort is normal. No respiratory distress.     Breath sounds: Normal breath sounds.  Musculoskeletal:     Cervical back: Neck supple.  Skin:    General: Skin is dry.  Neurological:     General: No focal deficit present.     Mental Status: She is alert. Mental status is at baseline.     Motor: No weakness.     Gait: Gait normal.  Psychiatric:        Mood and Affect: Mood normal.        Behavior: Behavior normal.         Thought Content: Thought content normal.      UC Treatments / Results  Labs (all labs ordered are listed, but only abnormal results are  displayed) Labs Reviewed  SARS CORONAVIRUS 2 (TAT 6-24 HRS)    EKG   Radiology No results found.  Procedures Procedures (including critical care time)  Medications Ordered in UC Medications - No data to display  Initial Impression / Assessment and Plan / UC Course  I have reviewed the triage vital signs and the nursing notes.  Pertinent labs & imaging results that were available during my care of the patient were reviewed by me and considered in my medical decision making (see chart for details).   All vital signs normal and stable.  Send out Covid testing performed.  Current CDC guidelines, isolation protocol, ED precautions reviewed.  Patient declines any prescriptions medications today.   Final Clinical Impressions(s) / UC Diagnoses   Final diagnoses:  Viral illness  Acute nonintractable headache, unspecified headache type  Chills     Discharge Instructions     You have received COVID testing today either for positive exposure, concerning symptoms that could be related to COVID infection, screening purposes, or re-testing after confirmed positive.  Your test obtained today checks for active viral infection in the last 1-2 weeks. If your test is negative now, you can still test positive later. So, if you do develop symptoms you should either get re-tested and/or isolate x 5 days and then strict mask use x 5 days (unvaccinated) or mask use x 10 days (vaccinated). Please follow CDC guidelines.  While Rapid antigen tests come back in 15-20 minutes, send out PCR/molecular test results typically come back within 1-3 days. In the mean time, if you are symptomatic, assume this could be a positive test and treat/monitor yourself as if you do have COVID.   We will call with test results if positive. Please download the MyChart app and set up  a profile to access test results.   If symptomatic, go home and rest. Push fluids. Take Tylenol as needed for discomfort. Gargle warm salt water. Throat lozenges. Take Mucinex DM or Robitussin for cough. Humidifier in bedroom to ease coughing. Warm showers. Also review the COVID handout for more information.  COVID-19 INFECTION: The incubation period of COVID-19 is approximately 14 days after exposure, with most symptoms developing in roughly 4-5 days. Symptoms may range in severity from mild to critically severe. Roughly 80% of those infected will have mild symptoms. People of any age may become infected with COVID-19 and have the ability to transmit the virus. The most common symptoms include: fever, fatigue, cough, body aches, headaches, sore throat, nasal congestion, shortness of breath, nausea, vomiting, diarrhea, changes in smell and/or taste.    COURSE OF ILLNESS Some patients may begin with mild disease which can progress quickly into critical symptoms. If your symptoms are worsening please call ahead to the Emergency Department and proceed there for further treatment. Recovery time appears to be roughly 1-2 weeks for mild symptoms and 3-6 weeks for severe disease.   GO IMMEDIATELY TO ER FOR FEVER YOU ARE UNABLE TO GET DOWN WITH TYLENOL, BREATHING PROBLEMS, CHEST PAIN, FATIGUE, LETHARGY, INABILITY TO EAT OR DRINK, ETC  QUARANTINE AND ISOLATION: To help decrease the spread of COVID-19 please remain isolated if you have COVID infection or are highly suspected to have COVID infection. This means -stay home and isolate to one room in the home if you live with others. Do not share a bed or bathroom with others while ill, sanitize and wipe down all countertops and keep common areas clean and disinfected. Stay home for 5 days. If you  have no symptoms or your symptoms are resolving after 5 days, you can leave your house. Continue to wear a mask around others for 5 additional days. If you have been in  close contact (within 6 feet) of someone diagnosed with COVID 19, you are advised to quarantine in your home for 14 days as symptoms can develop anywhere from 2-14 days after exposure to the virus. If you develop symptoms, you  must isolate.  Most current guidelines for COVID after exposure -unvaccinated: isolate 5 days and strict mask use x 5 days. Test on day 5 is possible -vaccinated: wear mask x 10 days if symptoms do not develop -You do not necessarily need to be tested for COVID if you have + exposure and  develop symptoms. Just isolate at home x10 days from symptom onset During this global pandemic, CDC advises to practice social distancing, try to stay at least 42ft away from others at all times. Wear a face covering. Wash and sanitize your hands regularly and avoid going anywhere that is not necessary.  KEEP IN MIND THAT THE COVID TEST IS NOT 100% ACCURATE AND YOU SHOULD STILL DO EVERYTHING TO PREVENT POTENTIAL SPREAD OF VIRUS TO OTHERS (WEAR MASK, WEAR GLOVES, WASH HANDS AND SANITIZE REGULARLY). IF INITIAL TEST IS NEGATIVE, THIS MAY NOT MEAN YOU ARE DEFINITELY NEGATIVE. MOST ACCURATE TESTING IS DONE 5-7 DAYS AFTER EXPOSURE.   It is not advised by CDC to get re-tested after receiving a positive COVID test since you can still test positive for weeks to months after you have already cleared the virus.   *If you have not been vaccinated for COVID, I strongly suggest you consider getting vaccinated as long as there are no contraindications.      ED Prescriptions    None     PDMP not reviewed this encounter.   Shirlee Latch, PA-C 12/24/20 [phone]

## 2020-12-25 LAB — SARS CORONAVIRUS 2 (TAT 6-24 HRS): SARS Coronavirus 2: NEGATIVE

## 2021-01-23 ENCOUNTER — Ambulatory Visit
Admission: EM | Admit: 2021-01-23 | Discharge: 2021-01-23 | Disposition: A | Discharge status: Home / Self Care | Payer: Medicare Other | Attending: Sports Medicine | Admitting: Sports Medicine

## 2021-01-23 ENCOUNTER — Other Ambulatory Visit: Payer: Self-pay

## 2021-01-23 DIAGNOSIS — M791 Myalgia, unspecified site: Secondary | ICD-10-CM | POA: Diagnosis not present

## 2021-01-23 DIAGNOSIS — R6889 Other general symptoms and signs: Secondary | ICD-10-CM

## 2021-01-23 DIAGNOSIS — R519 Headache, unspecified: Secondary | ICD-10-CM

## 2021-01-23 DIAGNOSIS — R52 Pain, unspecified: Secondary | ICD-10-CM | POA: Insufficient documentation

## 2021-01-23 DIAGNOSIS — R6883 Chills (without fever): Secondary | ICD-10-CM

## 2021-01-23 DIAGNOSIS — Z20822 Contact with and (suspected) exposure to covid-19: Secondary | ICD-10-CM | POA: Insufficient documentation

## 2021-01-23 DIAGNOSIS — J069 Acute upper respiratory infection, unspecified: Secondary | ICD-10-CM

## 2021-01-23 NOTE — Discharge Instructions (Addendum)
Your Covid test is pending at the time of discharge. Please see educational handouts. Please follow along your results on MyChart. Supportive care, over-the-counter meds as needed, Tylenol or Motrin for fever discomfort. If you develop any respiratory issues, shortness of breath, chest pain please call 911 or go to your nearest emergency room.  I hope you get to feeling better, Dr. Zachery Dauer

## 2021-01-23 NOTE — ED Provider Notes (Signed)
[facility]    CSN: [id number] Arrival date & time: 01/23/21  1813      History   Chief Complaint Chief Complaint  Patient presents with  . Generalized Body Aches    HPI Jill Garcia is a 55 y.o. female.   Patient is a pleasant 55 year old female who presents for evaluation of the above issues.  She reports 3 days of flulike symptoms.  These include headache, rhinorrhea, myalgia, scratchy throat.  She is also noted some chills.  No documented fever.  No chest pain or shortness of breath.  No nausea vomiting or diarrhea.  She did get Covid back in September 2021.  She was waiting to get vaccinated so she has not had her vaccine yet.  No flu shot either.  No known Covid exposure.  She does not work outside the home.  No red flag signs or symptoms elicited on history.     Past Medical History:  Diagnosis Date  . Anxiety   . Depression   . Fibromyalgia   . Insomnia   . Migraines   . Pseudotumor     There are no problems to display for this patient.   Past Surgical History:  Procedure Laterality Date  . LEG SURGERY    . SMALL INTESTINE SURGERY      OB History   No obstetric history on file.      Home Medications    Prior to Admission medications   Medication Sig Start Date End Date Taking? Authorizing Provider  AIMOVIG 140 MG/ML SOAJ SMARTSIG:140 Milligram(s) SUB-Q Once a Month 08/31/20  Yes [provider]  albuterol (VENTOLIN HFA) 108 (90 Base) MCG/ACT inhaler Inhale 2 puffs into the lungs every 6 (six) hours as needed for wheezing or shortness of breath. 09/20/20  Yes [place], Eric J, DO  ALPRAZolam Prudy Feeler) 0.5 MG tablet Take 0.5 mg by mouth at bedtime.    Yes [provider]  chlorzoxazone (PARAFON) 500 MG tablet Take 500 mg by mouth every 6 (six) hours as needed for muscle spasms.  08/08/20  Yes [provider]  cyanocobalamin 1000 MCG tablet Take 1,000 mcg by mouth daily.   Yes [provider]  gabapentin  (NEURONTIN) 300 MG capsule Take 300 mg by mouth as directed. Take 300 mg by mouth as directed Take in the evening as directed (Day 1: 300mg . Day 2: 600mg . Day 3-6: 900mg . Day 7-10: 1200mg . Day 11-14: 1500mg . Day 15+: 1800mg ) 08/01/20  Yes [provider]  ipratropium (ATROVENT) 0.06 % nasal spray Place 2 sprays into both nostrils 4 (four) times daily. 11/21/20  Yes B, PA-C  Ketorolac Tromethamine 15.75 MG/SPRAY SOLN Use ONE psray IN each nostril EVERY 8 HOURS AS NEEDED for migraine for 5 days 02/10/20  Yes [provider]  nitroGLYCERIN (NITROSTAT) 0.4 MG SL tablet SMARTSIG:1 Tablet(s) Sublingual PRN 04/03/20  Yes [provider]  sertraline (ZOLOFT) 50 MG tablet Take 50 mg by mouth at bedtime. 06/17/20  Yes [provider]  tiZANidine (ZANAFLEX) 2 MG tablet Take 2 mg by mouth 3 (three) times daily as needed. 05/12/20  Yes [provider]  topiramate (TOPAMAX) 200 MG tablet Take 200 mg by mouth at bedtime. 08/02/20  Yes [provider]  traZODone (DESYREL) 50 MG tablet Take 50 mg by mouth at bedtime.    Yes [provider]  DULoxetine (CYMBALTA) 60 MG capsule Take 90 mg by mouth daily.    [provider]    Family History  History reviewed. No pertinent family history.  Social History Social History   Tobacco Use  . Smoking status: Never Smoker  . Smokeless tobacco: Never Used  Vaping Use  . Vaping Use: Never used  Substance Use Topics  . Alcohol use: Never  . Drug use: Never     Allergies   Tape and Clindamycin   Review of Systems Review of Systems  Constitutional: Positive for chills. Negative for activity change, appetite change, fatigue and fever.  HENT: Positive for congestion, rhinorrhea and sore throat. Negative for ear discharge, ear pain, sinus pressure, sinus pain and sneezing.   Eyes: Negative for pain.  Respiratory: Negative for cough, chest tightness, shortness of breath, wheezing and  stridor.   Cardiovascular: Negative for chest pain and palpitations.  Gastrointestinal: Negative for abdominal pain, constipation, diarrhea, nausea and vomiting.  Genitourinary: Negative for dysuria and flank pain.  Musculoskeletal: Positive for myalgias.  Skin: Negative for color change, pallor, rash and wound.  Neurological: Positive for headaches. Negative for dizziness, syncope and light-headedness.  All other systems reviewed and are negative.    Physical Exam Triage Vital Signs ED Triage Vitals  Enc Vitals Group     BP 01/23/21 1925 113/75     Pulse Rate 01/23/21 1925 71     Resp 01/23/21 1925 18     Temp 01/23/21 1925 98.6 F (37 C)     Temp Source 01/23/21 1925 Oral     SpO2 01/23/21 1925 100 %     Weight 01/23/21 1923 295 lb (133.8 kg)     Height 01/23/21 1923 5\' 6"  (1.676 m)     Head Circumference --      Peak Flow --      Pain Score 01/23/21 1923 8     Pain Loc --      Pain Edu? --      Excl. in [place]? --    No data found.  Updated Vital Signs BP 113/75 (BP Location: Right Arm)   Pulse 71   Temp 98.6 F (37 C) (Oral)   Resp 18   Ht 5\' 6"  (1.676 m)   Wt 133.8 kg   SpO2 100%   BMI 47.61 kg/m   Visual Acuity Right Eye Distance:   Left Eye Distance:   Bilateral Distance:    Right Eye Near:   Left Eye Near:    Bilateral Near:     Physical Exam Vitals and nursing note reviewed.  Constitutional:      General: She is not in acute distress.    Appearance: Normal appearance. She is not ill-appearing or toxic-appearing.  HENT:     Head: Normocephalic and atraumatic.     Right Ear: Tympanic membrane normal.     Left Ear: Tympanic membrane normal.     Nose: Congestion present. No rhinorrhea.     Mouth/Throat:     Mouth: Mucous membranes are moist.     Pharynx: Posterior oropharyngeal erythema present. No oropharyngeal exudate.  Eyes:     Extraocular Movements: Extraocular movements intact.     Conjunctiva/sclera: Conjunctivae normal.     Pupils: Pupils  are equal, round, and reactive to light.  Cardiovascular:     Rate and Rhythm: Normal rate and regular rhythm.     Pulses: Normal pulses.     Heart sounds: Normal heart sounds. No murmur heard. No friction rub. No gallop.   Pulmonary:     Effort: Pulmonary effort is normal. No respiratory distress.     Breath  sounds: Normal breath sounds. No stridor. No wheezing, rhonchi or rales.  Musculoskeletal:     Cervical back: Neck supple. No rigidity or tenderness.  Lymphadenopathy:     Cervical: Cervical adenopathy present.  Skin:    General: Skin is warm and dry.     Capillary Refill: Capillary refill takes less than 2 seconds.  Neurological:     General: No focal deficit present.     Mental Status: She is alert and oriented to person, place, and time.      UC Treatments / Results  Labs (all labs ordered are listed, but only abnormal results are displayed) Labs Reviewed  SARS CORONAVIRUS 2 (TAT 6-24 HRS)    EKG   Radiology No results found.  Procedures Procedures (including critical care time)  Medications Ordered in UC Medications - No data to display  Initial Impression / Assessment and Plan / UC Course  I have reviewed the triage vital signs and the nursing notes.  Pertinent labs & imaging results that were available during my care of the patient were reviewed by me and considered in my medical decision making (see chart for details).   Clinical impression: 55 year old female with flulike symptoms including headache rhinorrhea myalgias scratchy throat and chills for 3 days.  She has not been vaccinated.  Treatment plan: 1.  The findings and treatment plan were discussed in detail with the patient.  Patient was in agreement. 2.  Recommended getting a COVID test.  It will take 6 to 24 hours to come back from the hospital.  I have asked her to isolate in the meantime pending the results.  If it is positive someone will contact her and she will need to switch to quarantine  per the current CDC guidelines. 3.  She is now working outside the home so she does not need a work note. 4.  Supportive care, over-the-counter meds as needed, Tylenol or Motrin for fever or discomfort.  Plenty of rest and plenty of fluids. 5.  Educational handouts were provided. 6.  Red flag signs and symptoms were discussed in detail and when to seek out immediate medical attention.  Patient voiced verbal understanding. 7.  Follow-up here as needed.    Final Clinical Impressions(s) / UC Diagnoses   Final diagnoses:  Flu-like symptoms  Viral upper respiratory tract infection  Acute nonintractable headache, unspecified headache type  Chills  Myalgia     Discharge Instructions     Your Covid test is pending at the time of discharge. Please see educational handouts. Please follow along your results on MyChart. Supportive care, over-the-counter meds as needed, Tylenol or Motrin for fever discomfort. If you develop any respiratory issues, shortness of breath, chest pain please call 911 or go to your nearest emergency room.  I hope you get to feeling better, Dr. Zachery Dauer    ED Prescriptions    None     PDMP not reviewed this encounter.   Delton See, MD 01/25/21 1125

## 2021-01-23 NOTE — ED Triage Notes (Signed)
Patient states that she has been having a cough,body aches, fatigue and nasal congestion with headaches x 3 days.

## 2021-01-24 LAB — SARS CORONAVIRUS 2 (TAT 6-24 HRS): SARS Coronavirus 2: NEGATIVE

## 2021-10-23 ENCOUNTER — Encounter: Payer: Self-pay | Admitting: Emergency Medicine

## 2021-10-23 ENCOUNTER — Emergency Department: Payer: Medicare Other

## 2021-10-23 ENCOUNTER — Other Ambulatory Visit: Payer: Self-pay

## 2021-10-23 DIAGNOSIS — R059 Cough, unspecified: Secondary | ICD-10-CM | POA: Insufficient documentation

## 2021-10-23 DIAGNOSIS — Z20822 Contact with and (suspected) exposure to covid-19: Secondary | ICD-10-CM | POA: Insufficient documentation

## 2021-10-23 DIAGNOSIS — R519 Headache, unspecified: Secondary | ICD-10-CM | POA: Diagnosis not present

## 2021-10-23 DIAGNOSIS — Z5321 Procedure and treatment not carried out due to patient leaving prior to being seen by health care provider: Secondary | ICD-10-CM | POA: Insufficient documentation

## 2021-10-23 DIAGNOSIS — R0602 Shortness of breath: Secondary | ICD-10-CM | POA: Diagnosis not present

## 2021-10-23 DIAGNOSIS — R079 Chest pain, unspecified: Secondary | ICD-10-CM | POA: Diagnosis not present

## 2021-10-23 DIAGNOSIS — J069 Acute upper respiratory infection, unspecified: Secondary | ICD-10-CM | POA: Diagnosis not present

## 2021-10-23 LAB — BASIC METABOLIC PANEL
Anion gap: 3 — ABNORMAL LOW (ref 5–15)
BUN: 23 mg/dL — ABNORMAL HIGH (ref 6–20)
CO2: 23 mmol/L (ref 22–32)
Calcium: 8.5 mg/dL — ABNORMAL LOW (ref 8.9–10.3)
Chloride: 116 mmol/L — ABNORMAL HIGH (ref 98–111)
Creatinine, Ser: 0.74 mg/dL (ref 0.44–1.00)
GFR, Estimated: >60 mL/min (ref >60)
Glucose, Bld: 88 mg/dL (ref 70–99)
Potassium: 3.9 mmol/L (ref 3.5–5.1)
Sodium: 142 mmol/L (ref 135–145)

## 2021-10-23 LAB — CBC
HCT: 37.2 % (ref 36.0–46.0)
Hemoglobin: 11.4 g/dL — ABNORMAL LOW (ref 12.0–15.0)
MCH: 28.1 pg (ref 26.0–34.0)
MCHC: 30.6 g/dL (ref 30.0–36.0)
MCV: 91.6 fL (ref 80.0–100.0)
Platelets: 212 10*3/uL (ref 150–400)
RBC: 4.06 MIL/uL (ref 3.87–5.11)
RDW: 13.9 % (ref 11.5–15.5)
WBC: 4.6 10*3/uL (ref 4.0–10.5)
nRBC: 0 % (ref 0.0–0.2)

## 2021-10-23 LAB — TROPONIN I (HIGH SENSITIVITY): Troponin I (High Sensitivity): <2 ng/L (ref <18)

## 2021-10-23 NOTE — ED Triage Notes (Signed)
Pt comes into the ED via POV c/o chest pain, shortness of breath, and headache.  Pt states that she has had a cough but the chest pain is there intermittently and is unrelated to the cough.  Pt states the chest has been intermittently uncomfortable a couple days.  Pt states she does see a cardiologist yearly and she does have nitro at home when she needs.  Pt explains the pain is in the center of her chest and it radiates into her back.  Pt currently has even and unlabored respirations, but she does have labored breathing with exertion.

## 2021-10-24 ENCOUNTER — Other Ambulatory Visit: Payer: Self-pay

## 2021-10-24 ENCOUNTER — Emergency Department
Admission: EM | Admit: 2021-10-24 | Discharge: 2021-10-24 | Disposition: A | Discharge status: Home / Self Care | Payer: Medicare Other | Attending: Emergency Medicine | Admitting: Emergency Medicine

## 2021-10-24 ENCOUNTER — Encounter: Payer: Self-pay | Admitting: Emergency Medicine

## 2021-10-24 DIAGNOSIS — J069 Acute upper respiratory infection, unspecified: Secondary | ICD-10-CM | POA: Insufficient documentation

## 2021-10-24 DIAGNOSIS — R059 Cough, unspecified: Secondary | ICD-10-CM | POA: Diagnosis present

## 2021-10-24 LAB — RESP PANEL BY RT-PCR (FLU A&B, COVID) ARPGX2
Influenza A by PCR: NEGATIVE
Influenza B by PCR: NEGATIVE
SARS Coronavirus 2 by RT PCR: NEGATIVE

## 2021-10-24 NOTE — ED Notes (Signed)
No answer when called several times from lobby 

## 2021-10-24 NOTE — ED Provider Notes (Signed)
[facility] Emergency Department Provider Note ____________________________________________   Event Date/Time   First MD Initiated Contact with Patient 10/24/21 [phone]     (approximate)  I have reviewed the triage vital signs and the nursing notes.  HISTORY  Chief Complaint Cough   HPI Jill Garcia is a 55 y.o. femalewho presents to the ED for evaluation of cough.   Chart review indicates morbid obesity, fibromyalgia, depression and pseudotumor cerebri. She had checked in yesterday afternoon and had some screening triage blood work, CXR and COVID/flu diagnostics performed that I reviewed.  She left prior to being evaluated. COVID/flu negative, 1 troponin negative, unremarkable CBC and BMP.  CXR noted below.  Patient presents to the ED for evaluation of about 1 week of a cough, scratchy throat, upper respiratory congestion, postnasal drip and "feeling like crud."  She reports caring for her grand daughter last week, who was "snotty" and had audible wheezing at that time.  She reports that her granddaughter was just diagnosed with RSV.   Patient reports developing chest pains with her coughing over the past 3 days, due to this she presents to the ED for evaluation.  She reports no chest pain with ambulation or position change.  Denies chest pain right now.  Past Medical History:  Diagnosis Date   Anxiety    Depression    Fibromyalgia    Insomnia    Migraines    Pseudotumor     There are no problems to display for this patient.   Past Surgical History:  Procedure Laterality Date   LEG SURGERY     SMALL INTESTINE SURGERY      Prior to Admission medications   Medication Sig Start Date End Date Taking? Authorizing Provider  AIMOVIG 140 MG/ML SOAJ SMARTSIG:140 Milligram(s) SUB-Q Once a Month 08/31/20   [provider]  albuterol (VENTOLIN HFA) 108 (90 Base) MCG/ACT inhaler Inhale 2 puffs into the lungs every 6 (six) hours as needed for  wheezing or shortness of breath. 09/20/20   [place], Alvira Philips, DO  ALPRAZolam Prudy Feeler) 0.5 MG tablet Take 0.5 mg by mouth at bedtime.     [provider]  chlorzoxazone (PARAFON) 500 MG tablet Take 500 mg by mouth every 6 (six) hours as needed for muscle spasms.  08/08/20   [provider]  cyanocobalamin 1000 MCG tablet Take 1,000 mcg by mouth daily.    [provider]  DULoxetine (CYMBALTA) 60 MG capsule Take 90 mg by mouth daily.    [provider]  gabapentin (NEURONTIN) 300 MG capsule Take 300 mg by mouth as directed. Take 300 mg by mouth as directed Take in the evening as directed (Day 1: 300mg . Day 2: 600mg . Day 3-6: 900mg . Day 7-10: 1200mg . Day 11-14: 1500mg . Day 15+: 1800mg ) 08/01/20   [provider]  ipratropium (ATROVENT) 0.06 % nasal spray Place 2 sprays into both nostrils 4 (four) times daily. 11/21/20   B, PA-C  Ketorolac Tromethamine 15.75 MG/SPRAY SOLN Use ONE psray IN each nostril EVERY 8 HOURS AS NEEDED for migraine for 5 days 02/10/20   [provider]  nitroGLYCERIN (NITROSTAT) 0.4 MG SL tablet SMARTSIG:1 Tablet(s) Sublingual PRN 04/03/20   [provider]  sertraline (ZOLOFT) 50 MG tablet Take 50 mg by mouth at bedtime. 06/17/20   [provider]  tiZANidine (ZANAFLEX) 2 MG tablet Take 2 mg by mouth 3 (three) times daily as needed. 05/12/20   [provider]  topiramate (TOPAMAX) 200 MG  tablet Take 200 mg by mouth at bedtime. 08/02/20   [provider]  traZODone (DESYREL) 50 MG tablet Take 50 mg by mouth at bedtime.     [provider]    Allergies Tape and Clindamycin  No family history on file.  Social History Social History   Tobacco Use   Smoking status: Never   Smokeless tobacco: Never  Vaping Use   Vaping Use: Never used  Substance Use Topics   Alcohol use: Never   Drug use: Never    Review of Systems  Constitutional: No fever/chills Eyes: No visual  changes. ENT:  Positive for upper respiratory congestion and "scratchy throat." Cardiovascular: Positive for chest pain. Respiratory: Denies shortness of breath.  Positive for cough Gastrointestinal: No abdominal pain.  No nausea, no vomiting.  No diarrhea.  No constipation. Genitourinary: Negative for dysuria. Musculoskeletal: Negative for back pain. Skin: Negative for rash. Neurological: Negative for headaches, focal weakness or numbness.  ____________________________________________   PHYSICAL EXAM:  VITAL SIGNS: Vitals:   10/24/21 0520 10/24/21 0520  BP:  127/86  Pulse:  72  Resp:  18  Temp: 98.3 F (36.8 C)   SpO2:  94%     Constitutional: Alert and oriented. Well appearing and in no acute distress.  Morbidly obese.  Conversational in full sentences. Eyes: Conjunctivae are normal. PERRL. EOMI. Head: Atraumatic. Nose: No congestion/rhinnorhea. Mouth/Throat: Mucous membranes are moist.  Oropharynx non-erythematous. Neck: No stridor. No cervical spine tenderness to palpation. Cardiovascular: Normal rate, regular rhythm. Grossly normal heart sounds.  Good peripheral circulation. Respiratory: Normal respiratory effort.  No retractions. Lungs CTAB. Gastrointestinal: Soft , nondistended, nontender to palpation. No CVA tenderness. Musculoskeletal: No lower extremity tenderness nor edema.  No joint effusions. No signs of acute trauma. Some mild and diffuse tenderness throughout bilateral chest wall Neurologic:  Normal speech and language. No gross focal neurologic deficits are appreciated. No gait instability noted. Skin:  Skin is warm, dry and intact. No rash noted. Psychiatric: Mood and affect are normal. Speech and behavior are normal.  ____________________________________________   LABS (all labs ordered are listed, but only abnormal results are displayed)  Labs Reviewed - No data to display ____________________________________________  12 Lead EKG  Sinus rhythm,  rate of 76 bpm.  Normal axis and intervals.  No evidence of acute ischemia. ____________________________________________  RADIOLOGY  ED MD interpretation: 2 view CXR reviewed by me with low lung volumes and slight increase in interstitial markings throughout.  No lobar infiltration or PTX.  Official radiology report(s): DG Chest 2 View  Result Date: 10/23/2021 CLINICAL DATA:  Chest pain and shortness of breath EXAM: CHEST - 2 VIEW COMPARISON:  09/16/2020 plain film and CT. FINDINGS: Lateral view degraded by patient arm position. Midline trachea. Normal heart size and mediastinal contours. No pleural effusion or pneumothorax. Diffuse pulmonary interstitial prominence in the setting of extremely low lung volumes. New since 12/29/2019 PA lateral films. IMPRESSION: Extremely low lung volumes with diffuse pulmonary interstitial prominence. Although this could be artifactual in the setting of low volumes, viral or atypical (mycoplasma) pneumonia are concerns. In this nonsmoker, the sequelae of asthma could look similar. Electronically Signed   By: Jeronimo Greaves M.D.   On: 10/23/2021 18:46    ____________________________________________   PROCEDURES and INTERVENTIONS  Procedure(s) performed (including Critical Care):  .1-3 Lead EKG Interpretation Performed by: Delton Prairie, MD Authorized by: Delton Prairie, MD     Interpretation: normal     ECG rate:  74   ECG rate  assessment: normal     Rhythm: sinus rhythm     Ectopy: none     Conduction: normal    Medications - No data to display  ____________________________________________   MDM / ED COURSE    55 year old woman presents to the ED with a viral syndrome and atypical chest pains without evidence of further acute pathology, amenable to outpatient management.  Normal vitals on room air.  Exam is reassuring without evidence of distress, neurologic or vascular deficits.  Some reproducible chest pain to bilateral chest walls, likely brought  on by her coughing.  No evidence of ACS, PTX or CAP.  No dictations for antibiotics at this time.  Suspect viral syndrome in the setting of her sick contacts from her grandchild.  We will discharge with return precautions and recommendations for symptomatic measures at home.     ____________________________________________   FINAL CLINICAL IMPRESSION(S) / ED DIAGNOSES  Final diagnoses:  Viral URI with cough     ED Discharge Orders     None        Jill Garcia   Note:  This document was prepared using Dragon voice recognition software and may include unintentional dictation errors.    Delton Prairie, MD 10/24/21 [phone]

## 2021-10-24 NOTE — ED Triage Notes (Signed)
Patient ambulatory to triage with steady gait, without difficulty or distress noted; pt here earlier tonight but left prior to being seen; st since last wk having prod cough white sputum with chills and nasal congestion with frontal HA

## 2021-10-24 NOTE — Discharge Instructions (Addendum)
Use Tylenol for pain and fevers.  Up to 1000 mg per dose, up to 4 times per day.  Do not take more than 4000 mg of Tylenol/acetaminophen within 24 hours..  

## 2022-05-17 IMAGING — CR DG CHEST 2V
2 series · 2 of 2 positions shown · non-contrast
Comparison: 09/16/2020 plain film and CT.

CLINICAL DATA: Chest pain and shortness of breath

EXAM:
CHEST - 2 VIEW

[chest pa]
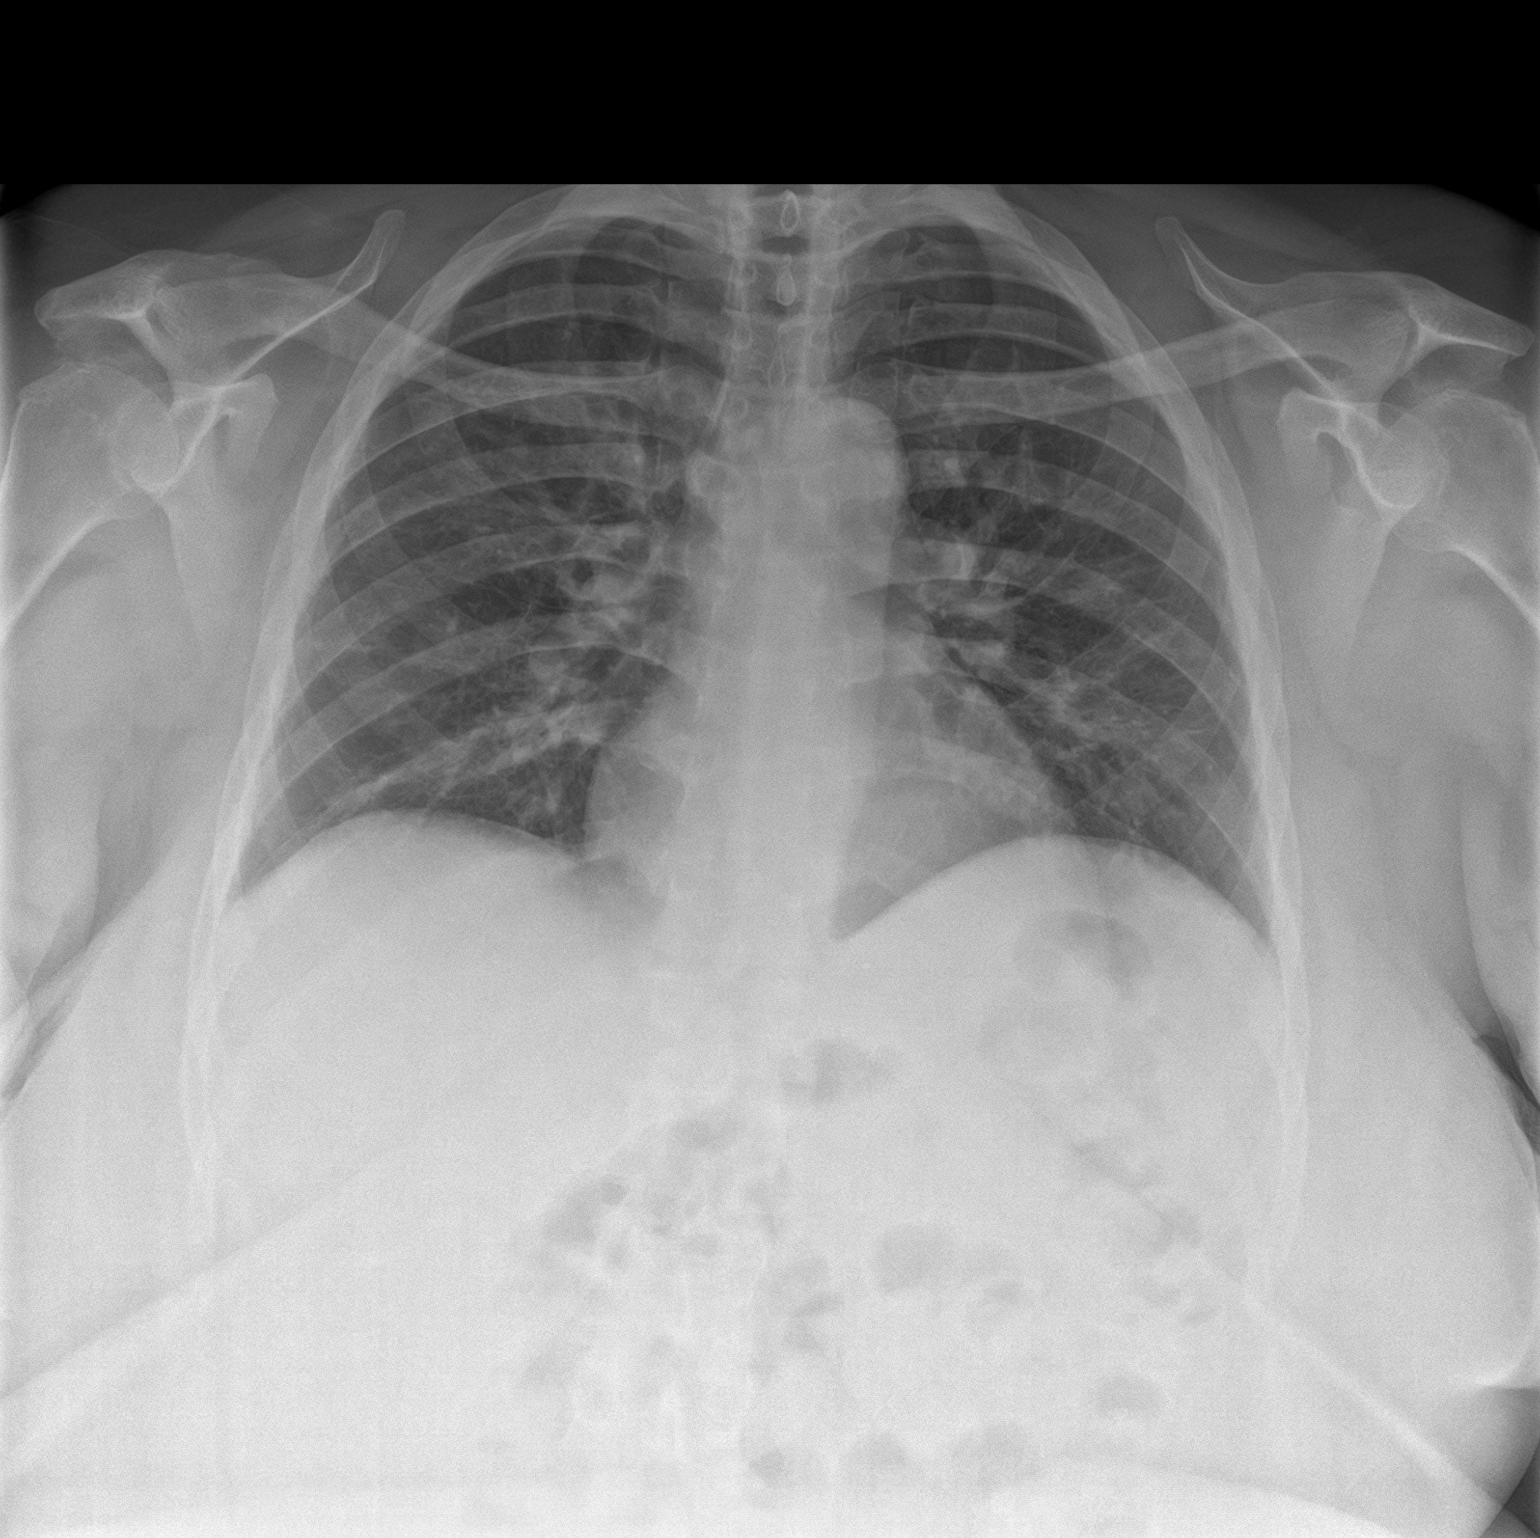

[chest lat]
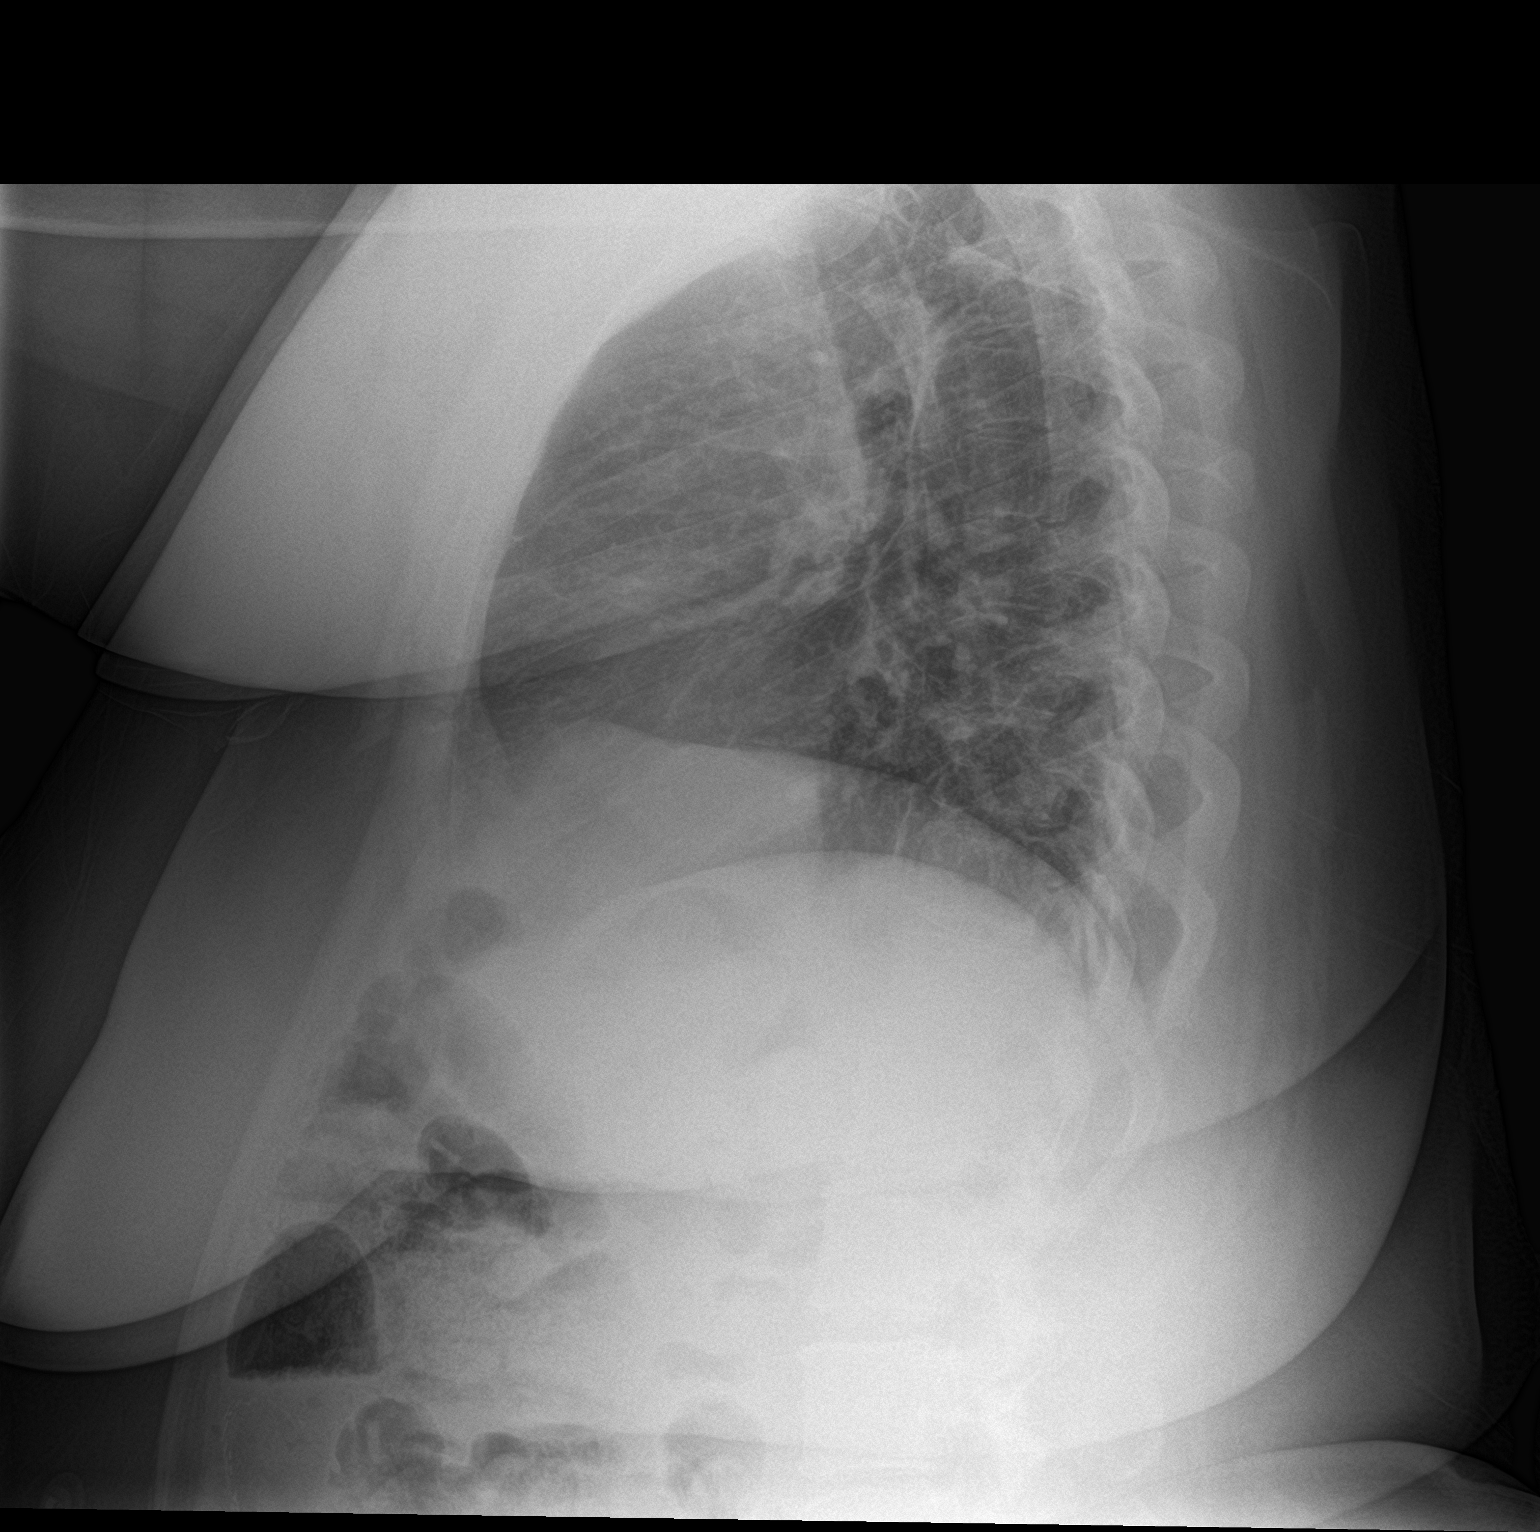

[2 of 2 positions shown; findings below may reference images not displayed]

FINDINGS: Lateral view degraded by patient arm position. Midline trachea.
Normal heart size and mediastinal contours. No pleural effusion or
pneumothorax. Diffuse pulmonary interstitial prominence in the
setting of extremely low lung volumes. New since 12/29/2019 PA
lateral films.
IMPRESSION: Extremely low lung volumes with diffuse pulmonary interstitial
prominence. Although this could be artifactual in the setting of low
volumes, viral or atypical (mycoplasma) pneumonia are concerns. In
this nonsmoker, the sequelae of asthma could look similar.

## 2024-06-06 LAB — COLOGUARD: COLOGUARD: POSITIVE — AB

## 2024-06-08 ENCOUNTER — Ambulatory Visit (INDEPENDENT_AMBULATORY_CARE_PROVIDER_SITE_OTHER)

## 2024-06-08 ENCOUNTER — Encounter: Payer: Self-pay | Admitting: Emergency Medicine

## 2024-06-08 ENCOUNTER — Ambulatory Visit (HOSPITAL_COMMUNITY): Payer: Self-pay

## 2024-06-08 ENCOUNTER — Ambulatory Visit
Admission: EM | Admit: 2024-06-08 | Discharge: 2024-06-08 | Disposition: A | Discharge status: Home / Self Care | Attending: Emergency Medicine | Admitting: Emergency Medicine

## 2024-06-08 DIAGNOSIS — R6883 Chills (without fever): Secondary | ICD-10-CM | POA: Insufficient documentation

## 2024-06-08 DIAGNOSIS — R0602 Shortness of breath: Secondary | ICD-10-CM | POA: Insufficient documentation

## 2024-06-08 LAB — CBC WITH DIFFERENTIAL/PLATELET
Abs Immature Granulocytes: 0.01 10*3/uL (ref 0.00–0.07)
Basophils Absolute: 0 10*3/uL (ref 0.0–0.1)
Basophils Relative: 1 %
Eosinophils Absolute: 0.1 10*3/uL (ref 0.0–0.5)
Eosinophils Relative: 2 %
HCT: 39.9 % (ref 36.0–46.0)
Hemoglobin: 13 g/dL (ref 12.0–15.0)
Immature Granulocytes: 0 %
Lymphocytes Relative: 30 %
Lymphs Abs: 1.3 10*3/uL (ref 0.7–4.0)
MCH: 28.9 pg (ref 26.0–34.0)
MCHC: 32.6 g/dL (ref 30.0–36.0)
MCV: 88.7 fL (ref 80.0–100.0)
Monocytes Absolute: 0.4 10*3/uL (ref 0.1–1.0)
Monocytes Relative: 9 %
Neutro Abs: 2.5 10*3/uL (ref 1.7–7.7)
Neutrophils Relative %: 58 %
Platelets: 228 10*3/uL (ref 150–400)
RBC: 4.5 MIL/uL (ref 3.87–5.11)
RDW: 13.3 % (ref 11.5–15.5)
WBC: 4.3 10*3/uL (ref 4.0–10.5)
nRBC: 0 % (ref 0.0–0.2)

## 2024-06-08 LAB — BASIC METABOLIC PANEL WITH GFR
Anion gap: 10 (ref 5–15)
BUN: 19 mg/dL (ref 6–20)
CO2: 21 mmol/L — ABNORMAL LOW (ref 22–32)
Calcium: 8.9 mg/dL (ref 8.9–10.3)
Chloride: 107 mmol/L (ref 98–111)
Creatinine, Ser: 0.84 mg/dL (ref 0.44–1.00)
GFR, Estimated: >60 mL/min (ref >60)
Glucose, Bld: 90 mg/dL (ref 70–99)
Potassium: 3.6 mmol/L (ref 3.5–5.1)
Sodium: 138 mmol/L (ref 135–145)

## 2024-06-08 LAB — SARS CORONAVIRUS 2 BY RT PCR: SARS Coronavirus 2 by RT PCR: NEGATIVE

## 2024-06-08 NOTE — ED Provider Notes (Addendum)
 [facility]    CSN: [id number] Arrival date & time: 06/08/24  1191      History   Chief Complaint Chief Complaint  Patient presents with   Chills    HPI Jill Garcia is a 58 y.o. female.   HPI  59 year old female with past medical history significant for pseudotumor cerebri, migraine headaches, insomnia, fibromyalgia, depression, anxiety, with a recent diagnosis of OSA on CPAP and atrial fibrillation presents for evaluation of chills that started 3 days ago.  She reports that they will start in her toes and move up throughout the rest of her body.  She has no associated upper or lower respiratory symptoms.  She endorses that she has had swelling on her lower extremities and that her primary care provider thinks that it is secondary to salt intake.  She is taking furosemide.  She is followed by [facility] cardiology and was started on Eliquis and carvedilol for her atrial fibrillation.  She also had an echocardiogram performed.  Past Medical History:  Diagnosis Date   Anxiety    Depression    Fibromyalgia    Insomnia    Migraines    Pseudotumor     There are no active problems to display for this patient.   Past Surgical History:  Procedure Laterality Date   LEG SURGERY     SMALL INTESTINE SURGERY      OB History   No obstetric history on file.      Home Medications    Prior to Admission medications   Medication Sig Start Date End Date Taking? Authorizing Provider  AIMOVIG 140 MG/ML SOAJ SMARTSIG:140 Milligram(s) SUB-Q Once a Month 08/31/20   [provider]  albuterol  (VENTOLIN  HFA) 108 (90 Base) MCG/ACT inhaler Inhale 2 puffs into the lungs every 6 (six) hours as needed for wheezing or shortness of breath. 09/20/20   [place], Rema Care, DO  ALPRAZolam  (XANAX ) 0.5 MG tablet Take 0.5 mg by mouth at bedtime.     [provider]  chlorzoxazone  (PARAFON ) 500 MG tablet Take 500 mg by mouth every 6 (six) hours as needed for muscle spasms.   08/08/20   [provider]  cyanocobalamin  1000 MCG tablet Take 1,000 mcg by mouth daily.    [provider]  DULoxetine  (CYMBALTA ) 60 MG capsule Take 90 mg by mouth daily.    [provider]  gabapentin  (NEURONTIN ) 300 MG capsule Take 300 mg by mouth as directed. Take 300 mg by mouth as directed Take in the evening as directed (Day 1: 300mg . Day 2: 600mg . Day 3-6: 900mg . Day 7-10: 1200mg . Day 11-14: 1500mg . Day 15+: 1800mg ) 08/01/20   [provider]  ipratropium (ATROVENT ) 0.06 % nasal spray Place 2 sprays into both nostrils 4 (four) times daily. 11/21/20   Nancy Axon B, PA-C  Ketorolac Tromethamine 15.75 MG/SPRAY SOLN Use ONE psray IN each nostril EVERY 8 HOURS AS NEEDED for migraine for 5 days 02/10/20   [provider]  nitroGLYCERIN  (NITROSTAT ) 0.4 MG SL tablet SMARTSIG:1 Tablet(s) Sublingual PRN 04/03/20   [provider]  sertraline  (ZOLOFT ) 50 MG tablet Take 50 mg by mouth at bedtime. 06/17/20   [provider]  tiZANidine  (ZANAFLEX ) 2 MG tablet Take 2 mg by mouth 3 (three) times daily as needed. 05/12/20   [provider]  topiramate  (TOPAMAX ) 200 MG tablet Take 200 mg by mouth at bedtime. 08/02/20   [provider]  traZODone  (DESYREL ) 50 MG tablet Take 50 mg by mouth at  bedtime.     [provider]    Family History History reviewed. No pertinent family history.  Social History Social History   Tobacco Use   Smoking status: Never   Smokeless tobacco: Never  Vaping Use   Vaping status: Never Used  Substance Use Topics   Alcohol use: Never   Drug use: Never     Allergies   Tape, Latex, Oxycodone, Clindamycin, and Other   Review of Systems Review of Systems  Constitutional:  Positive for chills.  HENT:  Negative for congestion, ear pain, rhinorrhea and sore throat.   Respiratory:  Positive for shortness of breath. Negative for cough.   Cardiovascular:  Positive for leg swelling.  Negative for chest pain and palpitations.     Physical Exam Triage Vital Signs ED Triage Vitals  Encounter Vitals Group     BP      Girls Systolic BP Percentile      Girls Diastolic BP Percentile      Boys Systolic BP Percentile      Boys Diastolic BP Percentile      Pulse      Resp      Temp      Temp src      SpO2      Weight      Height      Head Circumference      Peak Flow      Pain Score      Pain Loc      Pain Education      Exclude from Growth Chart    No data found.  Updated Vital Signs BP (!) 146/89 (BP Location: Right Arm)   Pulse 85   Temp 98 F (36.7 C) (Oral)   Resp 16   SpO2 96%   Visual Acuity Right Eye Distance:   Left Eye Distance:   Bilateral Distance:    Right Eye Near:   Left Eye Near:    Bilateral Near:     Physical Exam Vitals and nursing note reviewed.  Constitutional:      Appearance: Normal appearance. She is not ill-appearing.  HENT:     Head: Normocephalic and atraumatic.     Right Ear: Tympanic membrane, ear canal and external ear normal. There is no impacted cerumen.     Left Ear: Tympanic membrane, ear canal and external ear normal. There is no impacted cerumen.     Nose: Nose normal. No congestion or rhinorrhea.     Mouth/Throat:     Mouth: Mucous membranes are moist.     Pharynx: Oropharynx is clear. No oropharyngeal exudate or posterior oropharyngeal erythema.   Cardiovascular:     Rate and Rhythm: Normal rate and regular rhythm.     Pulses: Normal pulses.     Heart sounds: Normal heart sounds. No murmur heard.    No friction rub. No gallop.  Pulmonary:     Effort: Pulmonary effort is normal.     Breath sounds: Normal breath sounds. No wheezing, rhonchi or rales.   Musculoskeletal:     Cervical back: Normal range of motion and neck supple. No tenderness.     Right lower leg: Edema present.     Left lower leg: Edema present.     Comments: Bilateral lower extremity edema that is 2+ and pitting.  DP and PT pulses  are 2+.  Lymphadenopathy:     Cervical: No cervical adenopathy.   Skin:    General: Skin is warm and dry.  Capillary Refill: Capillary refill takes less than 2 seconds.   Neurological:     General: No focal deficit present.     Mental Status: She is alert and oriented to person, place, and time.      UC Treatments / Results  Labs (all labs ordered are listed, but only abnormal results are displayed) Labs Reviewed  BASIC METABOLIC PANEL WITH GFR - Abnormal; Notable for the following components:      Result Value   CO2 21 (*)    All other components within normal limits  SARS CORONAVIRUS 2 BY RT PCR  CBC WITH DIFFERENTIAL/PLATELET    EKG Normal sinus rhythm with sinus arrhythmia Ventricular rate 71 bpm PR interval 134 ms QRS duration 82 ms QT/QTc 378/410 ms Wavy baseline but no evidence of ST or T wave abnormalities.  Radiology No results found.  Procedures Procedures (including critical care time)  Medications Ordered in UC Medications - No data to display  Initial Impression / Assessment and Plan / UC Course  I have reviewed the triage vital signs and the nursing notes.  Pertinent labs & imaging results that were available during my care of the patient were reviewed by me and considered in my medical decision making (see chart for details).   Patient is a pleasant, nontoxic-appearing 87 old female presenting for evaluation of chills without other associated upper or lower respiratory symptoms.  She reports that they started 3 days ago and they will begin her toes and extend up through the rest of her body.  She denies any upper or lower respiratory symptoms.  She is concerned she may be developing the flu.  Her respiratory exam is benign.  Cardiopulmonary exam reveals S1-S2 heart sounds with regular rate and rhythm and lung sounds are clear to auscultation all fields.  The patient does have bilateral lower extremity swelling that is 2+ and pitting up to the level  of the thigh.  Her DP and PT pulses bilaterally are 2+.  She denies any color change to her toes and she denies any symptoms in her fingers which would suggest Raynaud's phenomenon.  She was recently diagnosed with sleep apnea as well as atrial fibrillation.  Patient echocardiogram on 03/15/2024 that showed an estimated EF of greater than 55% with normal atrial and ventricle function as well as normal valvular function globally.  Etiology of the patient's chills is unclear though I will order a COVID PCR.  I will not test for flu given that patient has had symptoms for 3 days.  I will also check chest x-ray and EKG along with CBC and BMP given that patient is on Lasix and I want to ensure she is not having electrolyte abnormality.  She also has a history of pernicious anemia and I want to evaluate for any worsening of her anemia.  COVID PCR is negative.  Chest x-ray independently reviewed and evaluated by me.  Impression: Lung fields are well aerated without evidence of infiltrate or effusion.  Cardiomediastinal silhouette appears normal.  Radiology overread is pending. Radiology impression states no active cardiopulmonary disease.  EKG shows normal sinus rhythm with sinus arrhythmia without ST or T wave abnormalities noted.  EKG compared to EKG dated 09/15/2020 and no appreciable changes noted.  CBC is unremarkable.  H&H is normal at 13.0 and 39.9.  BMP shows normal sodium of 138, nor potassium 3.6, and normal chloride of 107.  CO2 is mildly decreased at 21.  Renal function is also normal.  Etiology of  the patient's chills is unclear though it may be related to the edema she is experiencing in her lower extremities.  I will have her continue to use her Lasix to help with her lower extremity edema, elevate her legs is much as possible to see if this will further decrease her edema and help her symptoms.   Final Clinical Impressions(s) / UC Diagnoses   Final diagnoses:  Shortness of breath  Chills  (without fever)     Discharge Instructions      The cause of the chills you experience in your feet and body are unclear as your chest x-ray did not show any evidence of pneumonia, you tested negative for COVID today, you do not have any signs of anemia on your blood work or electrolyte abnormality.  The chills may be related to nerve compression due to the edema in your lower extremities.  Continue to take your Lasix as prescribed by your primary care provider.  I would also recommend that you elevate your legs is much as possible to help decrease swelling and see if this helps your symptoms.  If you do not have any improvement of your symptoms I would recommend that you follow-up with your primary care provider and/or your cardiologist.     ED Prescriptions   None    PDMP not reviewed this encounter.   Kent Pear, NP 06/08/24 1002    Kent Pear, NP 06/08/24 1110

## 2024-06-08 NOTE — Discharge Instructions (Addendum)
 The cause of the chills you experience in your feet and body are unclear as your chest x-ray did not show any evidence of pneumonia, you tested negative for COVID today, you do not have any signs of anemia on your blood work or electrolyte abnormality.  The chills may be related to nerve compression due to the edema in your lower extremities.  Continue to take your Lasix as prescribed by your primary care provider.  I would also recommend that you elevate your legs is much as possible to help decrease swelling and see if this helps your symptoms.  If you do not have any improvement of your symptoms I would recommend that you follow-up with your primary care provider and/or your cardiologist.

## 2024-06-08 NOTE — ED Triage Notes (Signed)
 Pt presents with chills all over for 3 days. She denies any other symptoms. She has taken Tylenol  for her symptoms.

## 2024-09-02 ENCOUNTER — Ambulatory Visit (INDEPENDENT_AMBULATORY_CARE_PROVIDER_SITE_OTHER)

## 2024-09-02 ENCOUNTER — Encounter: Payer: Self-pay | Admitting: Emergency Medicine

## 2024-09-02 ENCOUNTER — Ambulatory Visit: Admission: EM | Admit: 2024-09-02 | Discharge: 2024-09-02 | Disposition: A | Discharge status: Home / Self Care

## 2024-09-02 DIAGNOSIS — U071 COVID-19: Secondary | ICD-10-CM

## 2024-09-02 DIAGNOSIS — J069 Acute upper respiratory infection, unspecified: Secondary | ICD-10-CM

## 2024-09-02 DIAGNOSIS — R051 Acute cough: Secondary | ICD-10-CM

## 2024-09-02 MED ORDER — ALBUTEROL SULFATE HFA 108 (90 BASE) MCG/ACT IN AERS: 1.0000 | INHALATION_SPRAY | Freq: Four times a day (QID) | RESPIRATORY_TRACT | 0 refills | Status: AC | PRN

## 2024-09-02 NOTE — ED Provider Notes (Signed)
 [facility]    CSN: [id number] Arrival date & time: 09/02/24  1135      History   Chief Complaint Chief Complaint  Patient presents with   Nasal Congestion   Cough    HPI Jill Garcia is a 58 y.o. female.   58 year old female, Jill Garcia, presents to urgent care for evaluation/rule out pneumonia. Pt states she ahs cough runny nose chest congestion headache body aches that started on Sunday patient was seen at [facility] and tested positive for COVID is currently on amoxicillin for symptom management.  The history is provided by the patient. No language interpreter was used.    Past Medical History:  Diagnosis Date   Anxiety    Depression    Fibromyalgia    Insomnia    Migraines    Pseudotumor     Patient Active Problem List   Diagnosis Date Noted   Viral URI with cough 09/02/2024   COVID 09/16/2020    Past Surgical History:  Procedure Laterality Date   LEG SURGERY     SMALL INTESTINE SURGERY      OB History   No obstetric history on file.      Home Medications    Prior to Admission medications   Medication Sig Start Date End Date Taking? Authorizing Provider  ELIQUIS 5 MG TABS tablet Take 5 mg by mouth daily. 03/07/24  Yes [provider]  AIMOVIG 140 MG/ML SOAJ SMARTSIG:140 Milligram(s) SUB-Q Once a Month 08/31/20   [provider]  albuterol  (VENTOLIN  HFA) 108 (90 Base) MCG/ACT inhaler Inhale 1 puff into the lungs every 6 (six) hours as needed for wheezing or shortness of breath. 09/02/24   Saylor Sheckler, NP  ALPRAZolam  (XANAX ) 0.5 MG tablet Take 0.5 mg by mouth at bedtime.     [provider]  carvedilol (COREG) 6.25 MG tablet Take 6.25 mg by mouth 2 (two) times daily.    [provider]  chlorzoxazone  (PARAFON ) 500 MG tablet Take 500 mg by mouth every 6 (six) hours as needed for muscle spasms.  08/08/20   [provider]  cyanocobalamin  1000 MCG tablet Take 1,000 mcg by mouth  daily.    [provider]  DULoxetine  (CYMBALTA ) 60 MG capsule Take 90 mg by mouth daily.    [provider]  gabapentin  (NEURONTIN ) 300 MG capsule Take 300 mg by mouth as directed. Take 300 mg by mouth as directed Take in the evening as directed (Day 1: 300mg . Day 2: 600mg . Day 3-6: 900mg . Day 7-10: 1200mg . Day 11-14: 1500mg . Day 15+: 1800mg ) 08/01/20   [provider]  ipratropium (ATROVENT ) 0.06 % nasal spray Place 2 sprays into both nostrils 4 (four) times daily. 11/21/20   Arvis Huxley B, PA-C  Ketorolac Tromethamine 15.75 MG/SPRAY SOLN Use ONE psray IN each nostril EVERY 8 HOURS AS NEEDED for migraine for 5 days 02/10/20   [provider]  nitroGLYCERIN  (NITROSTAT ) 0.4 MG SL tablet SMARTSIG:1 Tablet(s) Sublingual PRN 04/03/20   [provider]  sertraline  (ZOLOFT ) 50 MG tablet Take 50 mg by mouth at bedtime. 06/17/20   [provider]  tiZANidine  (ZANAFLEX ) 2 MG tablet Take 2 mg by mouth 3 (three) times daily as needed. 05/12/20   [provider]  topiramate  (TOPAMAX ) 200 MG tablet Take 200 mg by mouth at bedtime. 08/02/20   [provider]  traZODone  (DESYREL ) 50 MG tablet Take 50 mg by mouth at bedtime.     [provider]  Family History History reviewed. No pertinent family history.  Social History Social History   Tobacco Use   Smoking status: Never   Smokeless tobacco: Never  Vaping Use   Vaping status: Never Used  Substance Use Topics   Alcohol use: Never   Drug use: Never     Allergies   Tape, Latex, Oxycodone, Clindamycin, and Other   Review of Systems Review of Systems  Constitutional:  Positive for fever.  HENT:  Positive for congestion and rhinorrhea.   Respiratory:  Positive for cough. Negative for shortness of breath, wheezing and stridor.   Cardiovascular:  Negative for chest pain and palpitations.  Musculoskeletal:  Positive for myalgias.  Neurological:  Positive for headaches.   All other systems reviewed and are negative.    Physical Exam Triage Vital Signs ED Triage Vitals  Encounter Vitals Group     BP      Girls Systolic BP Percentile      Girls Diastolic BP Percentile      Boys Systolic BP Percentile      Boys Diastolic BP Percentile      Pulse      Resp      Temp      Temp src      SpO2      Weight      Height      Head Circumference      Peak Flow      Pain Score      Pain Loc      Pain Education      Exclude from Growth Chart    No data found.  Updated Vital Signs BP 122/81 (BP Location: Left Arm)   Pulse 61   Temp 99.9 F (37.7 C) (Oral)   Resp 16   Ht 5' 6 (1.676 m)   Wt 294 lb 15.6 oz (133.8 kg)   SpO2 97%   BMI 47.61 kg/m   Visual Acuity Right Eye Distance:   Left Eye Distance:   Bilateral Distance:    Right Eye Near:   Left Eye Near:    Bilateral Near:     Physical Exam Vitals and nursing note reviewed.  Constitutional:      General: She is not in acute distress.    Appearance: She is well-developed.  HENT:     Head: Normocephalic.     Right Ear: Tympanic membrane is retracted.     Left Ear: Tympanic membrane is retracted.     Nose: Mucosal edema and congestion present.     Mouth/Throat:     Lips: Pink.     Mouth: Mucous membranes are moist.     Pharynx: Oropharynx is clear. Uvula midline.  Eyes:     General: Lids are normal.     Conjunctiva/sclera: Conjunctivae normal.     Pupils: Pupils are equal, round, and reactive to light.  Neck:     Trachea: No tracheal deviation.  Cardiovascular:     Rate and Rhythm: Normal rate and regular rhythm.     Heart sounds: Normal heart sounds. No murmur heard. Pulmonary:     Effort: Pulmonary effort is normal.     Breath sounds: Normal breath sounds and air entry.  Abdominal:     General: Bowel sounds are normal.     Palpations: Abdomen is soft.     Tenderness: There is no abdominal tenderness.  Musculoskeletal:        General: Normal range of motion.      Cervical  back: Normal range of motion.  Lymphadenopathy:     Cervical: No cervical adenopathy.  Skin:    General: Skin is warm and dry.     Findings: No rash.  Neurological:     General: No focal deficit present.     Mental Status: She is alert and oriented to person, place, and time.     GCS: GCS eye subscore is 4. GCS verbal subscore is 5. GCS motor subscore is 6.  Psychiatric:        Speech: Speech normal.        Behavior: Behavior normal. Behavior is cooperative.      UC Treatments / Results  Labs (all labs ordered are listed, but only abnormal results are displayed) Labs Reviewed - No data to display  EKG   Radiology DG Chest 2 View Result Date: 09/02/2024 CLINICAL DATA:  58 year old female positive COVID-19. Persistent cough. EXAM: CHEST - 2 VIEW COMPARISON:  Chest radiograph 06/08/2024 and earlier. FINDINGS: Lower lung volumes compared to June, similar to 2022 exams. Mediastinal contours remain normal. Visualized tracheal air column is within normal limits. No consolidation or pleural effusion. Mild increased patchy, streaky interstitial opacity in both lungs. No confluent opacity. No acute osseous abnormality identified. Negative visible bowel gas. IMPRESSION: Lower lung volumes with mildly increased streaky and interstitial bilateral lung opacity compatible with viral respiratory infection. No confluent pneumonia at this time, no effusion. Electronically Signed   By: VEAR Hurst M.D.   On: 09/02/2024 12:36    Procedures Procedures (including critical care time)  Medications Ordered in UC Medications - No data to display  Initial Impression / Assessment and Plan / UC Course  I have reviewed the triage vital signs and the nursing notes.  Pertinent labs & imaging results that were available during my care of the patient were reviewed by me and considered in my medical decision making (see chart for details).    Discussed exam findings and plan of care with patient, patient  requesting refill of her albuterol  will send in prescription , x-ray is negative for pneumonia , complete your antibiotics , drink plenty of fluids , strict go to ER precautions given.   Patient verbalized understanding to this provider.  Ddx: COVID, viral URI with cough, allergies, pneumonia Final Clinical Impressions(s) / UC Diagnoses   Final diagnoses:  COVID  Viral URI with cough     Discharge Instructions      Your xray was negative for pneumonia, appears consistent with viral respiratory infection per radiology. Rest,push fluids, finish your antibiotic, take home meds as prescribed. Follow up with your PCP.  If you have chest pain,shortness of breath or worsening symptoms go to Er for further evaluation.      ED Prescriptions     Medication Sig Dispense Auth. Provider   albuterol  (VENTOLIN  HFA) 108 (90 Base) MCG/ACT inhaler Inhale 1 puff into the lungs every 6 (six) hours as needed for wheezing or shortness of breath. 1 each Bralon Antkowiak, Rilla, NP      PDMP not reviewed this encounter.   Aminta Rilla, NP 09/02/24 1816

## 2024-09-02 NOTE — ED Triage Notes (Signed)
 Patient c/o cough, runny nose, chest congestion, and headache and bodyaches that started on Sunday.  Patient unsure of fevers.  Patient took a covid test at home on Tuesday and was positive.

## 2024-09-02 NOTE — Discharge Instructions (Addendum)
 Your xray was negative for pneumonia, appears consistent with viral respiratory infection per radiology. Rest,push fluids, finish your antibiotic, take home meds as prescribed. Follow up with your PCP.  If you have chest pain,shortness of breath or worsening symptoms go to Er for further evaluation.

## 2024-11-09 ENCOUNTER — Ambulatory Visit
Admission: EM | Admit: 2024-11-09 | Discharge: 2024-11-09 | Disposition: A | Discharge status: Home / Self Care | Attending: Emergency Medicine | Admitting: Emergency Medicine

## 2024-11-09 DIAGNOSIS — J069 Acute upper respiratory infection, unspecified: Secondary | ICD-10-CM

## 2024-11-09 LAB — POC SOFIA SARS ANTIGEN FIA: SARS Coronavirus 2 Ag: NEGATIVE

## 2024-11-09 LAB — POCT INFLUENZA A/B
Influenza A, POC: NEGATIVE
Influenza B, POC: NEGATIVE

## 2024-11-09 MED ORDER — IPRATROPIUM BROMIDE 0.06 % NA SOLN: 2.0000 | Freq: Four times a day (QID) | NASAL | 12 refills | Status: AC

## 2024-11-09 NOTE — ED Provider Notes (Signed)
 [facility]    CSN: [id number] Arrival date & time: 11/09/24  1753      History   Chief Complaint Chief Complaint  Patient presents with   Nasal Congestion   Headache    HPI Jill Garcia is a 58 y.o. female.   HPI  58 year old female with past medical history significant for pseudotumor cerebri, migraine headaches, insomnia, fibromyalgia, depression, anxiety, OSA on CPAP presents for evaluation 2 days worth of respiratory symptoms that include headache, sinus pressure in her cheeks, watery eyes, nasal congestion with clear nasal discharge, sweats, and chills.  She denies any fever, ear pain, sore throat, or cough.  Past Medical History:  Diagnosis Date   Anxiety    Depression    Fibromyalgia    Insomnia    Migraines    Pseudotumor     Patient Active Problem List   Diagnosis Date Noted   Viral URI with cough 09/02/2024   COVID 09/16/2020    Past Surgical History:  Procedure Laterality Date   LEG SURGERY     SMALL INTESTINE SURGERY      OB History   No obstetric history on file.      Home Medications    Prior to Admission medications   Medication Sig Start Date End Date Taking? Authorizing Provider  ALPRAZolam  (XANAX ) 0.5 MG tablet Take 0.5 mg by mouth at bedtime.    Yes [provider]  carvedilol (COREG) 6.25 MG tablet Take 6.25 mg by mouth 2 (two) times daily.   Yes [provider]  chlorzoxazone  (PARAFON ) 500 MG tablet Take 500 mg by mouth every 6 (six) hours as needed for muscle spasms.  08/08/20  Yes [provider]  doxepin (SINEQUAN) 10 MG capsule Take 10 mg by mouth at bedtime.   Yes [provider]  DULoxetine  (CYMBALTA ) 60 MG capsule Take 90 mg by mouth daily.   Yes [provider]  ELIQUIS 5 MG TABS tablet Take 5 mg by mouth daily. 03/07/24  Yes [provider]  pregabalin (LYRICA) 50 MG capsule TAKE 1 CAPSULE BY MOUTH IN THE MORNING AND 1 CAPSULE MIDDAY AND 2 CAPSULES EVERY  NIGHT AT BEDTIME. 10/10/21  Yes [provider]  tirzepatide (MOUNJARO) 10 MG/0.5ML Pen ADMINISTER 10 MG UNDER THE SKIN EVERY 7 DAYS   Yes [provider]  topiramate  (TOPAMAX ) 200 MG tablet Take 200 mg by mouth at bedtime. 08/02/20  Yes [provider]  AIMOVIG 140 MG/ML SOAJ SMARTSIG:140 Milligram(s) SUB-Q Once a Month 08/31/20   [provider]  albuterol  (VENTOLIN  HFA) 108 (90 Base) MCG/ACT inhaler Inhale 1 puff into the lungs every 6 (six) hours as needed for wheezing or shortness of breath. 09/02/24   Defelice, Rilla, NP  cyanocobalamin  1000 MCG tablet Take 1,000 mcg by mouth daily.    [provider]  gabapentin  (NEURONTIN ) 300 MG capsule Take 300 mg by mouth as directed. Take 300 mg by mouth as directed Take in the evening as directed (Day 1: 300mg . Day 2: 600mg . Day 3-6: 900mg . Day 7-10: 1200mg . Day 11-14: 1500mg . Day 15+: 1800mg ) 08/01/20   [provider]  ipratropium (ATROVENT ) 0.06 % nasal spray Place 2 sprays into both nostrils 4 (four) times daily. 11/09/24   Bernardino Ditch, NP  Ketorolac Tromethamine 15.75 MG/SPRAY SOLN Use ONE psray IN each nostril EVERY 8 HOURS AS NEEDED for migraine for 5 days 02/10/20   [provider]  nitroGLYCERIN  (NITROSTAT ) 0.4 MG SL tablet SMARTSIG:1 Tablet(s) Sublingual PRN 04/03/20  [provider]  sertraline  (ZOLOFT ) 50 MG tablet Take 50 mg by mouth at bedtime. 06/17/20   [provider]  tiZANidine  (ZANAFLEX ) 2 MG tablet Take 2 mg by mouth 3 (three) times daily as needed. 05/12/20   [provider]  traZODone  (DESYREL ) 50 MG tablet Take 50 mg by mouth at bedtime.     [provider]    Family History History reviewed. No pertinent family history.  Social History Social History   Tobacco Use   Smoking status: Never   Smokeless tobacco: Never  Vaping Use   Vaping status: Never Used  Substance Use Topics   Alcohol use: Never   Drug use: Never      Allergies   Tape, Latex, Oxycodone, Clindamycin, and Other   Review of Systems Review of Systems  Constitutional:  Positive for chills and diaphoresis. Negative for fever.  HENT:  Positive for congestion and rhinorrhea. Negative for ear pain and sore throat.   Eyes:  Positive for discharge.       Eyes are watery.  Respiratory:  Negative for cough.      Physical Exam Triage Vital Signs ED Triage Vitals  Encounter Vitals Group     BP      Girls Systolic BP Percentile      Girls Diastolic BP Percentile      Boys Systolic BP Percentile      Boys Diastolic BP Percentile      Pulse      Resp      Temp      Temp src      SpO2      Weight      Height      Head Circumference      Peak Flow      Pain Score      Pain Loc      Pain Education      Exclude from Growth Chart    No data found.  Updated Vital Signs BP 129/87 (BP Location: Right Arm)   Pulse 85   Temp 98.5 F (36.9 C) (Oral)   Resp 18   Wt 285 lb (129.3 kg)   SpO2 98%   BMI 46.00 kg/m   Visual Acuity Right Eye Distance:   Left Eye Distance:   Bilateral Distance:    Right Eye Near:   Left Eye Near:    Bilateral Near:     Physical Exam Vitals and nursing note reviewed.  Constitutional:      Appearance: Normal appearance. She is not ill-appearing.  HENT:     Head: Normocephalic and atraumatic.     Right Ear: Tympanic membrane, ear canal and external ear normal. There is no impacted cerumen.     Left Ear: Tympanic membrane, ear canal and external ear normal. There is no impacted cerumen.     Nose: Congestion and rhinorrhea present.     Comments: Nasal mucosa is edematous with clear discharge in both naris.    Mouth/Throat:     Mouth: Mucous membranes are moist.     Pharynx: Oropharynx is clear. Posterior oropharyngeal erythema present. No oropharyngeal exudate.     Comments: Mild erythema to the posterior pharynx with clear postnasal drip. Eyes:     Extraocular Movements: Extraocular  movements intact.     Conjunctiva/sclera: Conjunctivae normal.     Pupils: Pupils are equal, round, and reactive to light.  Cardiovascular:     Rate and Rhythm: Normal rate and regular rhythm.  Pulses: Normal pulses.     Heart sounds: Normal heart sounds. No murmur heard.    No friction rub. No gallop.  Pulmonary:     Effort: Pulmonary effort is normal.     Breath sounds: Normal breath sounds. No wheezing, rhonchi or rales.  Musculoskeletal:     Cervical back: Normal range of motion and neck supple. No tenderness.  Lymphadenopathy:     Cervical: No cervical adenopathy.  Skin:    General: Skin is warm and dry.     Capillary Refill: Capillary refill takes less than 2 seconds.     Findings: No rash.  Neurological:     General: No focal deficit present.     Mental Status: She is alert and oriented to person, place, and time.      UC Treatments / Results  Labs (all labs ordered are listed, but only abnormal results are displayed) Labs Reviewed  POCT INFLUENZA A/B - Normal  POC SOFIA SARS ANTIGEN FIA - Normal    EKG   Radiology No results found.  Procedures Procedures (including critical care time)  Medications Ordered in UC Medications - No data to display  Initial Impression / Assessment and Plan / UC Course  I have reviewed the triage vital signs and the nursing notes.  Pertinent labs & imaging results that were available during my care of the patient were reviewed by me and considered in my medical decision making (see chart for details).   Patient is a pleasant, nontoxic-appearing 58 year old female presenting for evaluation of 2 days with the respiratory symptoms as outlined HPI above.  Her symptoms are confined to her upper respiratory tract and consist of nasal congestion and clear nasal discharge, dryness in her nose, and sinus pressure.  She does use a CPAP and uses humidification.  She is not experiencing any cough.  She reports that she feels bad but she  does not feel like when she had COVID in the past.  Differential diagnose include COVID, influenza, viral respiratory illness.  I will order a COVID antigen test as well as an influenza antigen test.  Influenza antigen test is negative.  COVID antigen test is negative.  I will discharge patient with diagnosis of viral URI.  I will prescribe Atrovent  nasal spray for the nasal congestion.  She will use over-the-counter Tylenol  or ibuprofen as needed for pain and fever.  Return precautions reviewed.   Final Clinical Impressions(s) / UC Diagnoses   Final diagnoses:  Viral upper respiratory tract infection     Discharge Instructions      Your testing today was negative for COVID or influenza.  Your exam is consistent with a viral upper respiratory tract infection.  Use the Atrovent  nasal spray, 2 squirts of each nostril every 6 hours, as needed for runny nose or nasal congestion.  You may use over-the-counter Tylenol  and/or ibuprofen according to the package instruction as needed for any fever or pain.  If you develop any new or worsening symptoms other return for reevaluation or follow-up with your primary care provider.     ED Prescriptions     Medication Sig Dispense Auth. Provider   ipratropium (ATROVENT ) 0.06 % nasal spray Place 2 sprays into both nostrils 4 (four) times daily. 15 mL Bernardino Ditch, NP      PDMP not reviewed this encounter.   Bernardino Ditch, NP 11/09/24 [phone]

## 2024-11-09 NOTE — ED Triage Notes (Signed)
 Sx x 2 days  Sinus pressure Nasal congestion Watery eyes Chills Headache   Flonase

## 2024-11-09 NOTE — Discharge Instructions (Addendum)
 Your testing today was negative for COVID or influenza.  Your exam is consistent with a viral upper respiratory tract infection.  Use the Atrovent  nasal spray, 2 squirts of each nostril every 6 hours, as needed for runny nose or nasal congestion.  You may use over-the-counter Tylenol  and/or ibuprofen according to the package instruction as needed for any fever or pain.  If you develop any new or worsening symptoms other return for reevaluation or follow-up with your primary care provider.
